# Patient Record
Sex: Female | Born: 1975 | Race: White | Hispanic: No | Marital: Married | Smoking: Never smoker
Health system: Southern US, Community
[De-identification: ages and names within clinical notes are randomized; demographics above are authoritative.]

## PROBLEM LIST (undated history)

## (undated) DIAGNOSIS — Z87442 Personal history of urinary calculi: Secondary | ICD-10-CM

## (undated) HISTORY — PX: OTHER SURGICAL HISTORY: SHX169

## (undated) HISTORY — PX: TUBAL LIGATION: SHX77

## (undated) HISTORY — PX: DILATION AND CURETTAGE OF UTERUS: SHX78

---

## 2014-03-16 ENCOUNTER — Emergency Department: Payer: Self-pay | Admitting: Emergency Medicine

## 2014-03-16 LAB — URINALYSIS, COMPLETE
BILIRUBIN, UR: NEGATIVE
Glucose,UR: NEGATIVE mg/dL (ref 0–75)
Ketone: NEGATIVE
NITRITE: NEGATIVE
Ph: 6 (ref 4.5–8.0)
Protein: 100
RBC,UR: 4 /HPF (ref 0–5)
SPECIFIC GRAVITY: 1.01 (ref 1.003–1.030)

## 2014-03-16 LAB — COMPREHENSIVE METABOLIC PANEL
ALK PHOS: 77 U/L
ANION GAP: 9 (ref 7–16)
Albumin: 3.7 g/dL (ref 3.4–5.0)
BILIRUBIN TOTAL: 0.4 mg/dL (ref 0.2–1.0)
BUN: 6 mg/dL — ABNORMAL LOW (ref 7–18)
CO2: 26 mmol/L (ref 21–32)
CREATININE: 0.89 mg/dL (ref 0.60–1.30)
Calcium, Total: 9.2 mg/dL (ref 8.5–10.1)
Chloride: 101 mmol/L (ref 98–107)
EGFR (Non-African Amer.): >60
Glucose: 97 mg/dL (ref 65–99)
OSMOLALITY: 269 (ref 275–301)
POTASSIUM: 3.4 mmol/L — AB (ref 3.5–5.1)
SGOT(AST): 17 U/L (ref 15–37)
SGPT (ALT): 18 U/L
SODIUM: 136 mmol/L (ref 136–145)
Total Protein: 8 g/dL (ref 6.4–8.2)

## 2014-03-16 LAB — CBC
HCT: 39.4 % (ref 35.0–47.0)
HGB: 12.8 g/dL (ref 12.0–16.0)
MCH: 30.3 pg (ref 26.0–34.0)
MCHC: 32.5 g/dL (ref 32.0–36.0)
MCV: 93 fL (ref 80–100)
PLATELETS: 166 10*3/uL (ref 150–440)
RBC: 4.23 10*6/uL (ref 3.80–5.20)
RDW: 13.4 % (ref 11.5–14.5)
WBC: 11.6 10*3/uL — ABNORMAL HIGH (ref 3.6–11.0)

## 2014-03-16 LAB — TROPONIN I

## 2014-03-18 LAB — URINE CULTURE

## 2014-08-10 ENCOUNTER — Emergency Department: Payer: Self-pay | Admitting: Emergency Medicine

## 2014-08-10 LAB — COMPREHENSIVE METABOLIC PANEL
Albumin: 4.1 g/dL (ref 3.4–5.0)
Alkaline Phosphatase: 55 U/L (ref 46–116)
Anion Gap: 7 (ref 7–16)
BUN: 12 mg/dL (ref 7–18)
Bilirubin,Total: 0.7 mg/dL (ref 0.2–1.0)
CALCIUM: 9.1 mg/dL (ref 8.5–10.1)
CO2: 28 mmol/L (ref 21–32)
Chloride: 103 mmol/L (ref 98–107)
Creatinine: 1.04 mg/dL (ref 0.60–1.30)
EGFR (African American): >60
GLUCOSE: 107 mg/dL — AB (ref 65–99)
Osmolality: 276 (ref 275–301)
POTASSIUM: 3.9 mmol/L (ref 3.5–5.1)
SGOT(AST): 11 U/L — ABNORMAL LOW (ref 15–37)
SGPT (ALT): 11 U/L — ABNORMAL LOW (ref 14–63)
Sodium: 138 mmol/L (ref 136–145)
Total Protein: 7.8 g/dL (ref 6.4–8.2)

## 2014-08-10 LAB — CBC
HCT: 40.7 % (ref 35.0–47.0)
HGB: 13.2 g/dL (ref 12.0–16.0)
MCH: 30.6 pg (ref 26.0–34.0)
MCHC: 32.5 g/dL (ref 32.0–36.0)
MCV: 94 fL (ref 80–100)
Platelet: 183 10*3/uL (ref 150–440)
RBC: 4.32 10*6/uL (ref 3.80–5.20)
RDW: 13.7 % (ref 11.5–14.5)
WBC: 11.5 10*3/uL — ABNORMAL HIGH (ref 3.6–11.0)

## 2014-08-10 LAB — URINALYSIS, COMPLETE
BILIRUBIN, UR: NEGATIVE
Bacteria: NONE SEEN
Blood: NEGATIVE
GLUCOSE, UR: NEGATIVE mg/dL (ref 0–75)
LEUKOCYTE ESTERASE: NEGATIVE
Nitrite: NEGATIVE
PROTEIN: NEGATIVE
Ph: 6 (ref 4.5–8.0)
RBC,UR: 1 /HPF (ref 0–5)
SPECIFIC GRAVITY: 1.017 (ref 1.003–1.030)
Squamous Epithelial: <1
WBC UR: 1 /HPF (ref 0–5)

## 2020-08-05 ENCOUNTER — Other Ambulatory Visit: Payer: Self-pay | Admitting: Family Medicine

## 2020-08-05 DIAGNOSIS — Z1231 Encounter for screening mammogram for malignant neoplasm of breast: Secondary | ICD-10-CM

## 2020-08-08 ENCOUNTER — Other Ambulatory Visit: Payer: Self-pay | Admitting: Family Medicine

## 2020-08-08 DIAGNOSIS — N649 Disorder of breast, unspecified: Secondary | ICD-10-CM

## 2020-08-14 ENCOUNTER — Other Ambulatory Visit: Payer: Self-pay

## 2020-08-14 ENCOUNTER — Ambulatory Visit
Admission: RE | Admit: 2020-08-14 | Discharge: 2020-08-14 | Disposition: A | Discharge status: Home / Self Care | Payer: BLUE CROSS/BLUE SHIELD | Source: Ambulatory Visit | Attending: Family Medicine | Admitting: Family Medicine

## 2020-08-14 DIAGNOSIS — N649 Disorder of breast, unspecified: Secondary | ICD-10-CM

## 2022-08-18 DIAGNOSIS — Z13220 Encounter for screening for lipoid disorders: Secondary | ICD-10-CM | POA: Diagnosis not present

## 2022-08-18 DIAGNOSIS — Z130 Encounter for screening for diseases of the blood and blood-forming organs and certain disorders involving the immune mechanism: Secondary | ICD-10-CM | POA: Diagnosis not present

## 2022-08-18 DIAGNOSIS — Z1331 Encounter for screening for depression: Secondary | ICD-10-CM | POA: Diagnosis not present

## 2022-08-18 DIAGNOSIS — Z131 Encounter for screening for diabetes mellitus: Secondary | ICD-10-CM | POA: Diagnosis not present

## 2022-08-18 DIAGNOSIS — Z1211 Encounter for screening for malignant neoplasm of colon: Secondary | ICD-10-CM | POA: Diagnosis not present

## 2022-08-18 DIAGNOSIS — Z0000 Encounter for general adult medical examination without abnormal findings: Secondary | ICD-10-CM | POA: Diagnosis not present

## 2022-08-18 DIAGNOSIS — Z1231 Encounter for screening mammogram for malignant neoplasm of breast: Secondary | ICD-10-CM | POA: Diagnosis not present

## 2022-08-18 DIAGNOSIS — Z124 Encounter for screening for malignant neoplasm of cervix: Secondary | ICD-10-CM | POA: Diagnosis not present

## 2022-08-18 IMAGING — US US BREAST*L* LIMITED INC AXILLA
1 series · 5 of 5 positions shown · non-contrast
Comparison: None.

CLINICAL DATA: Left 12 o'clock subareolar breast area of palpable
concern felt by the patient and her physician.

EXAM:
DIGITAL DIAGNOSTIC BILATERAL MAMMOGRAM WITH TOMOSYNTHESIS AND CAD;
ULTRASOUND LEFT BREAST LIMITED
TECHNIQUE: Bilateral digital diagnostic mammography and breast tomosynthesis
was performed. The images were evaluated with computer-aided
detection.; Targeted ultrasound examination of the left breast was
performed.

[Series 1: us breast*left* limited inc axilla · 0.06mm/px · 5 of 5 slices shown]
[im 1/5]
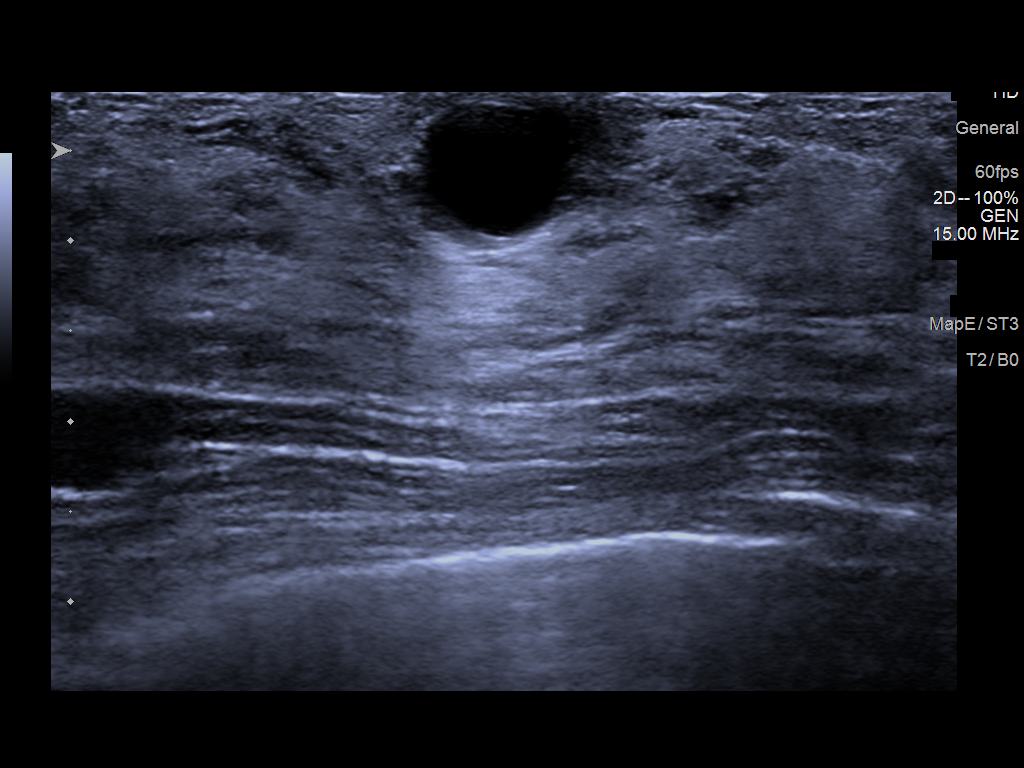
[im 2/5]
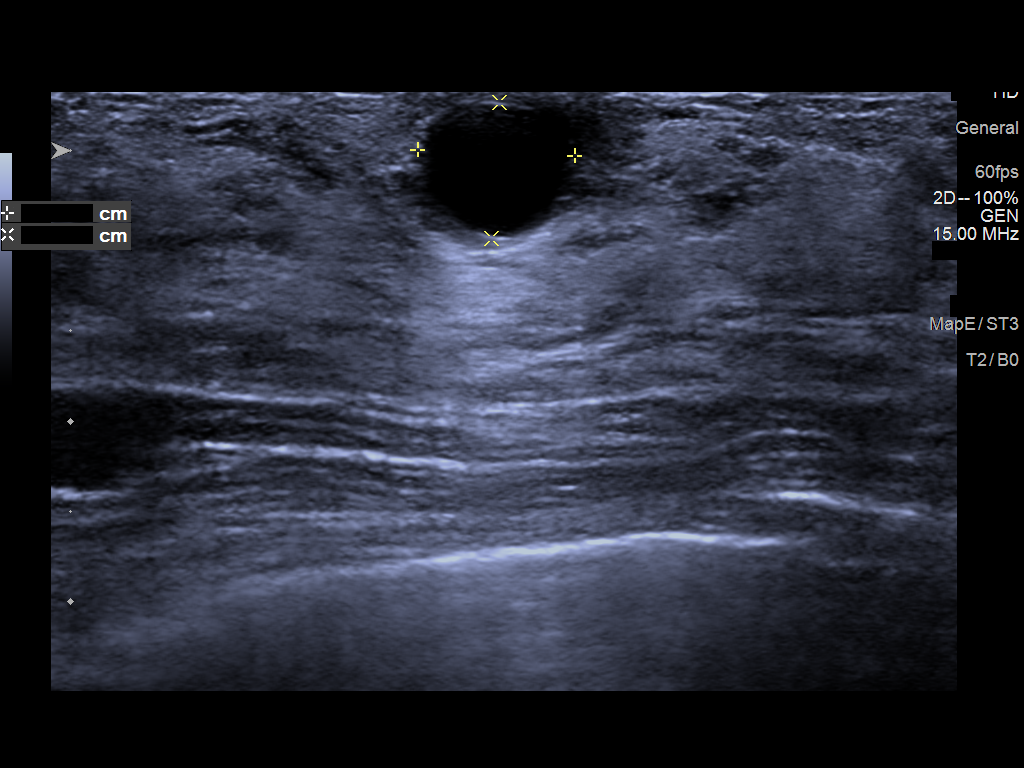
[im 3/5]
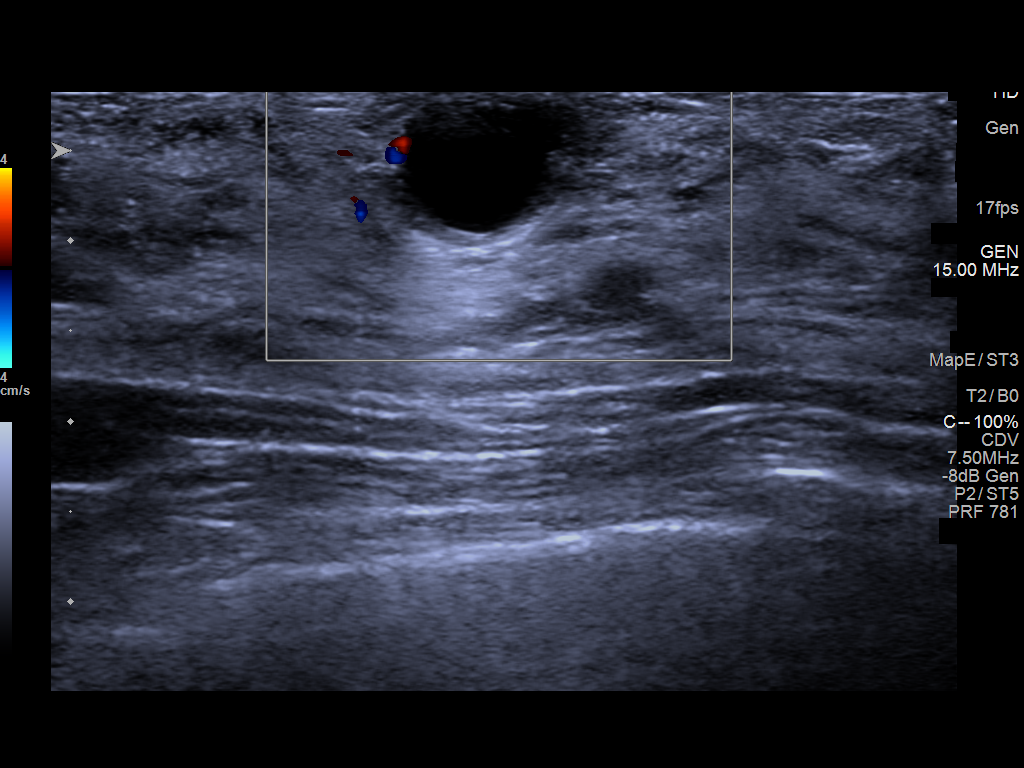
[im 4/5]
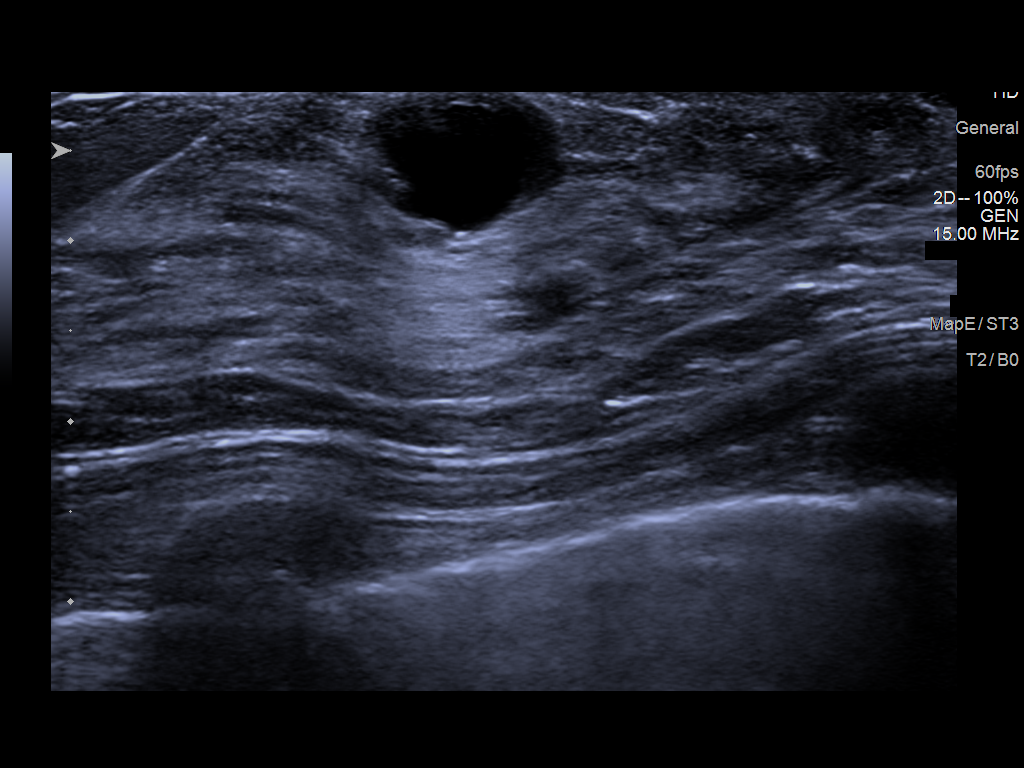
[im 5/5]
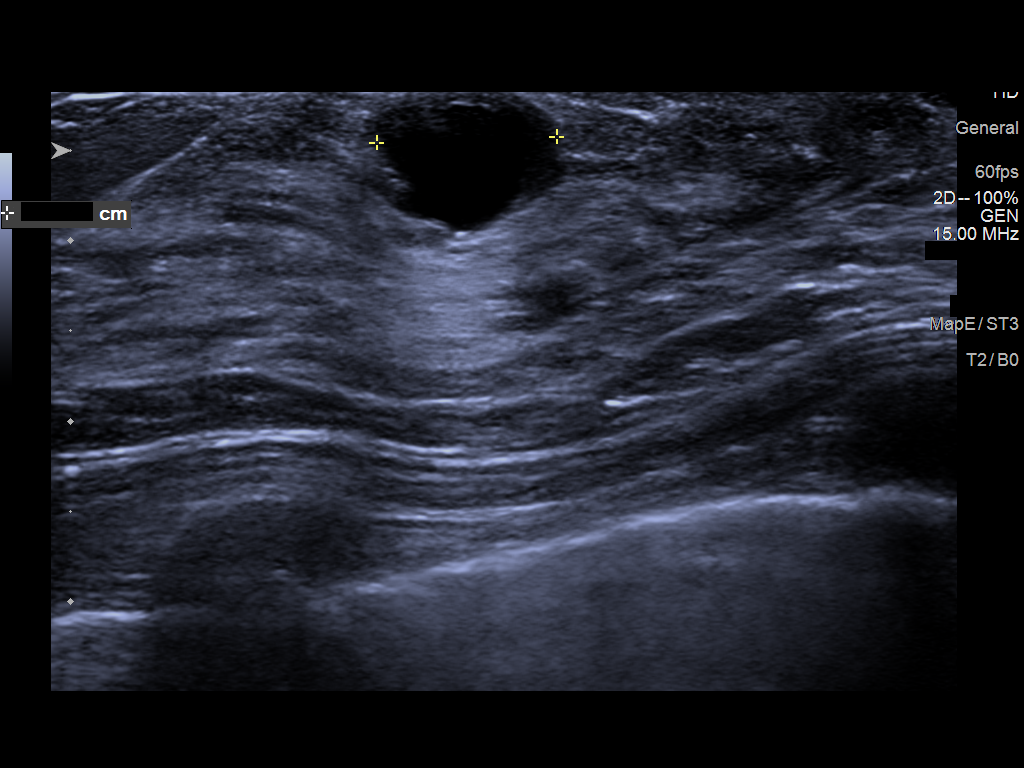

[5 of 5 positions shown; findings below may reference images not displayed]

ACR Breast Density Category d: The breast tissue is extremely dense,
which lowers the sensitivity of mammography.
FINDINGS: Mammographically, there are no suspicious masses, areas of
architectural distortion or microcalcifications in either breast.
The area of palpable concern in the left 12 o'clock breast,
subareolar location, corresponds to isodense to breast parenchyma
circumscribed benign-appearing mass.

On physical exam, freely mobile moderately firm approximately 1 cm
mass is palpated immediately superior to the left nipple.

Targeted left breast ultrasound is performed, showing left breast 12
o'clock, 1 cm from the nipple benign cyst measuring 1.0 x 0.9 x
cm. This finding corresponds to the mammographically seen mass.
IMPRESSION: Left breast 12 o'clock benign cyst corresponds to the area of
palpable concern felt by the patient.

RECOMMENDATION:
Screening mammogram in one year.(Code:JN-G-F15)

I have discussed the findings and recommendations with the patient.
If applicable, a reminder letter will be sent to the patient
regarding the next appointment.

BI-RADS CATEGORY  2: Benign.

## 2022-08-18 IMAGING — MG DIGITAL DIAGNOSTIC BILAT W/ TOMO W/ CAD
6 of 10 series · 6 of 30 positions shown · non-contrast
Comparison: None.

CLINICAL DATA: Left 12 o'clock subareolar breast area of palpable
concern felt by the patient and her physician.

EXAM:
DIGITAL DIAGNOSTIC BILATERAL MAMMOGRAM WITH TOMOSYNTHESIS AND CAD;
ULTRASOUND LEFT BREAST LIMITED
TECHNIQUE: Bilateral digital diagnostic mammography and breast tomosynthesis
was performed. The images were evaluated with computer-aided
detection.; Targeted ultrasound examination of the left breast was
performed.

[L TAN synth-2D]
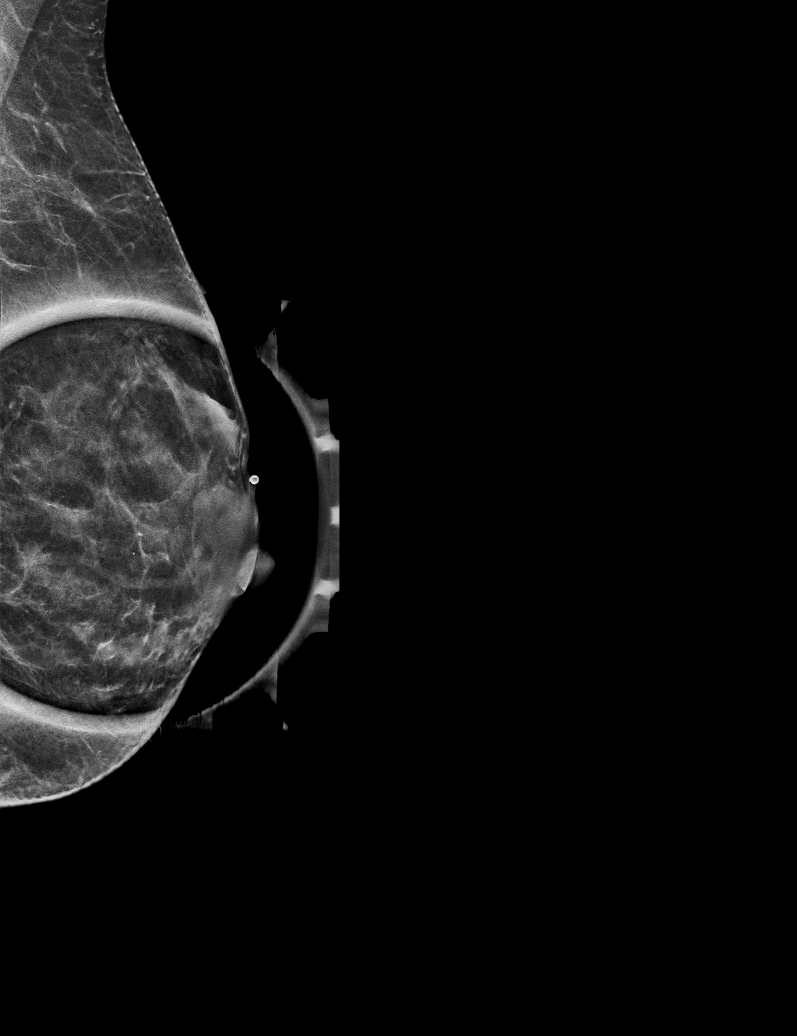

[R MLO synth-2D]
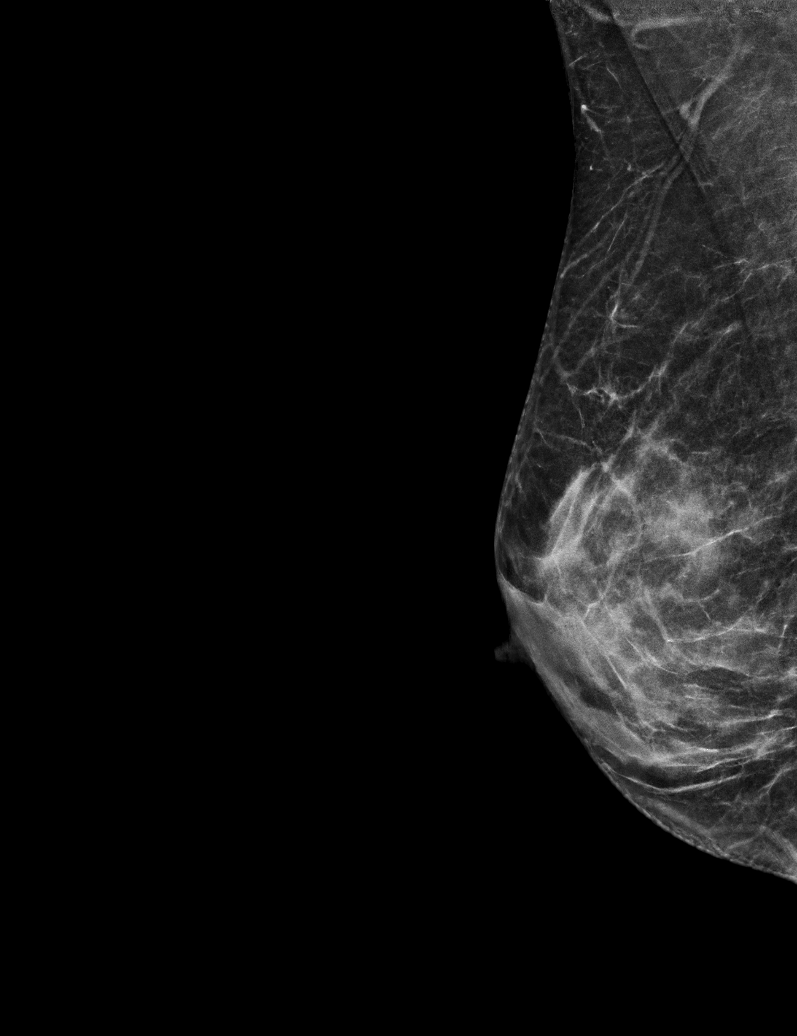

[R CC synth-2D]
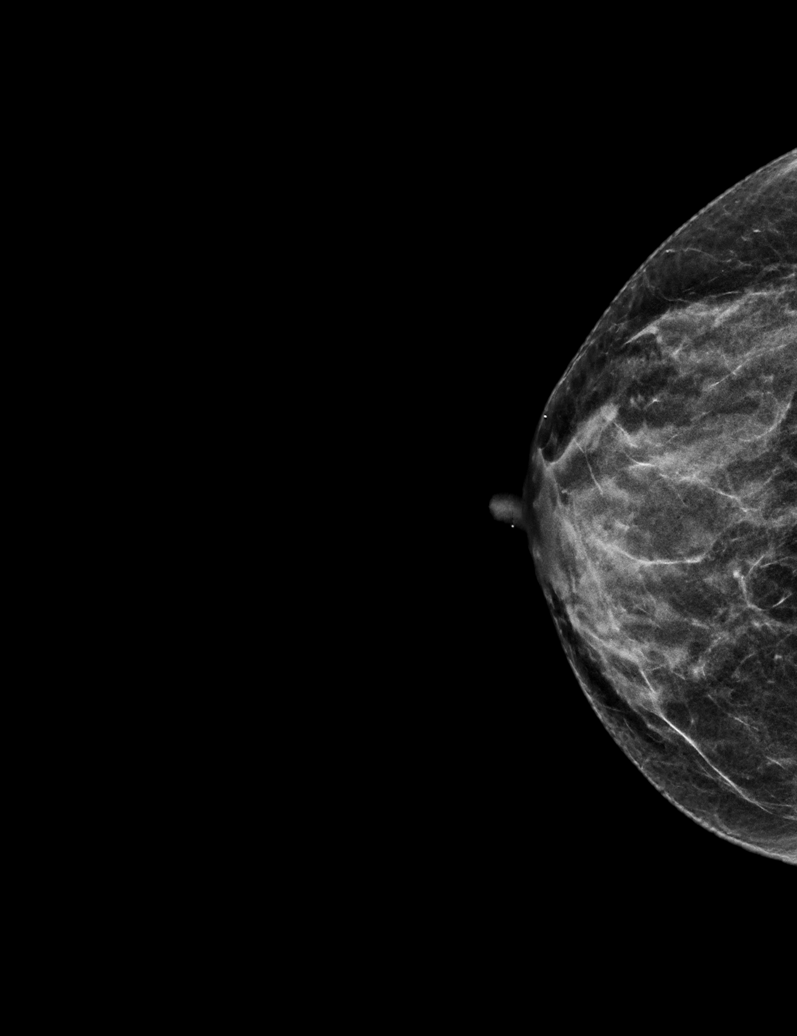

[L MLO synth-2D]
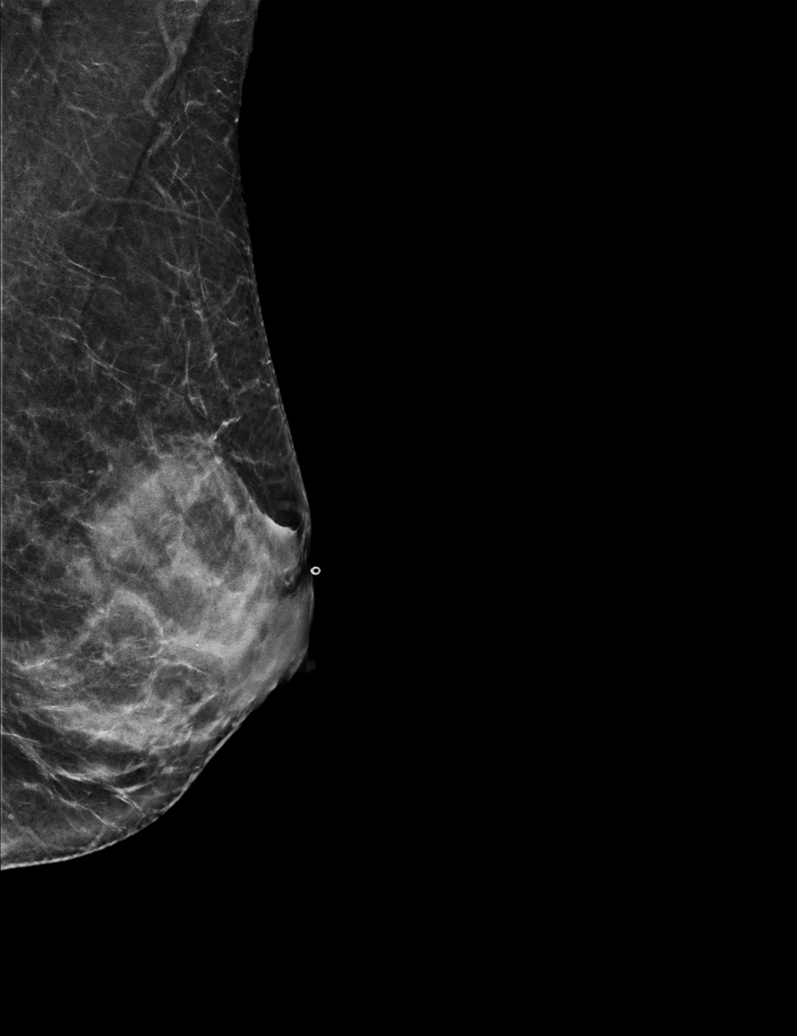

[L CC synth-2D]
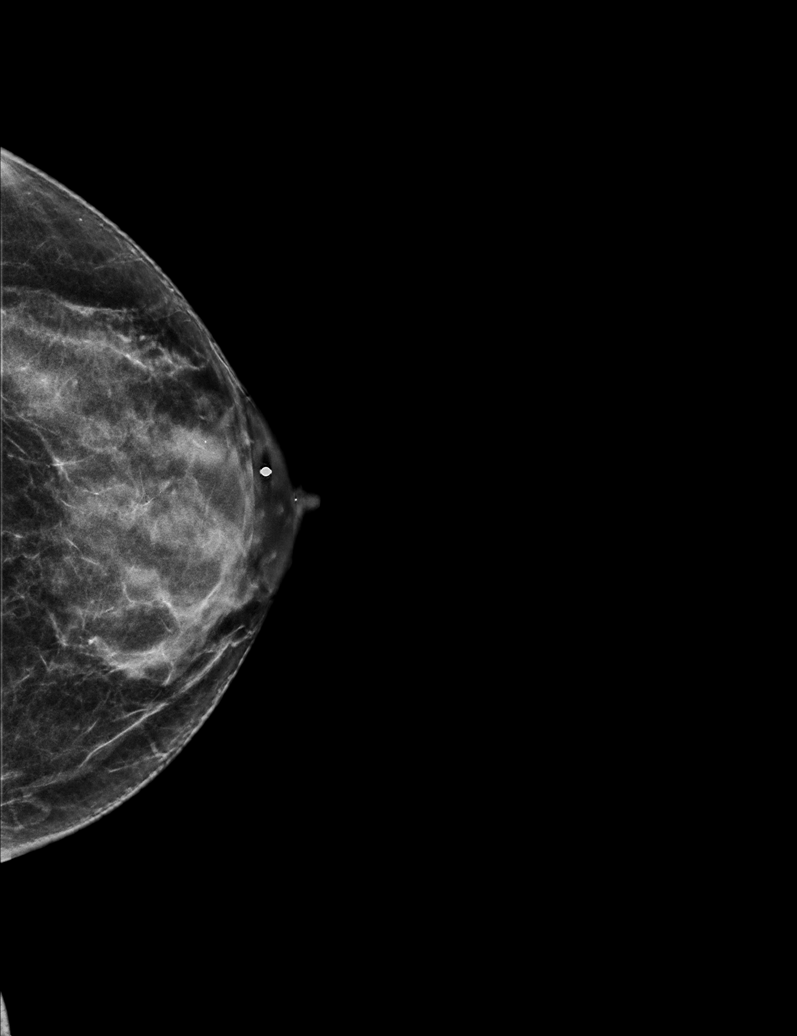

[R MLO tomo · tomo slice 27/53.0]
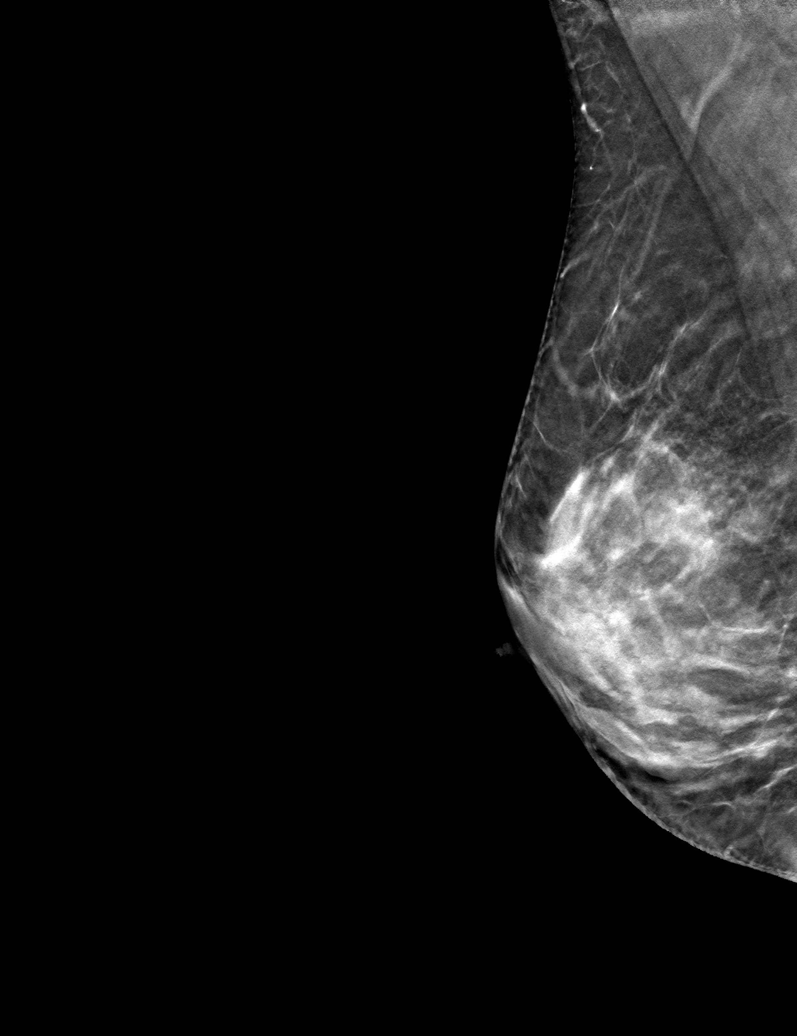

[6 of 30 positions shown; findings below may reference images not displayed]

ACR Breast Density Category d: The breast tissue is extremely dense,
which lowers the sensitivity of mammography.
FINDINGS: Mammographically, there are no suspicious masses, areas of
architectural distortion or microcalcifications in either breast.
The area of palpable concern in the left 12 o'clock breast,
subareolar location, corresponds to isodense to breast parenchyma
circumscribed benign-appearing mass.

On physical exam, freely mobile moderately firm approximately 1 cm
mass is palpated immediately superior to the left nipple.

Targeted left breast ultrasound is performed, showing left breast 12
o'clock, 1 cm from the nipple benign cyst measuring 1.0 x 0.9 x
cm. This finding corresponds to the mammographically seen mass.
IMPRESSION: Left breast 12 o'clock benign cyst corresponds to the area of
palpable concern felt by the patient.

RECOMMENDATION:
Screening mammogram in one year.(Code:JN-G-F15)

I have discussed the findings and recommendations with the patient.
If applicable, a reminder letter will be sent to the patient
regarding the next appointment.

BI-RADS CATEGORY  2: Benign.

## 2022-08-20 ENCOUNTER — Other Ambulatory Visit: Payer: Self-pay | Admitting: Family Medicine

## 2022-08-20 DIAGNOSIS — Z1231 Encounter for screening mammogram for malignant neoplasm of breast: Secondary | ICD-10-CM

## 2022-08-25 DIAGNOSIS — H5203 Hypermetropia, bilateral: Secondary | ICD-10-CM | POA: Diagnosis not present

## 2022-09-01 DIAGNOSIS — Z131 Encounter for screening for diabetes mellitus: Secondary | ICD-10-CM | POA: Diagnosis not present

## 2022-09-01 DIAGNOSIS — Z13220 Encounter for screening for lipoid disorders: Secondary | ICD-10-CM | POA: Diagnosis not present

## 2022-09-01 DIAGNOSIS — Z130 Encounter for screening for diseases of the blood and blood-forming organs and certain disorders involving the immune mechanism: Secondary | ICD-10-CM | POA: Diagnosis not present

## 2022-09-07 ENCOUNTER — Ambulatory Visit
Admission: RE | Admit: 2022-09-07 | Discharge: 2022-09-07 | Disposition: A | Discharge status: Home / Self Care | Payer: 59 | Source: Ambulatory Visit | Attending: Family Medicine | Admitting: Family Medicine

## 2022-09-07 DIAGNOSIS — Z1231 Encounter for screening mammogram for malignant neoplasm of breast: Secondary | ICD-10-CM | POA: Diagnosis not present

## 2023-02-02 ENCOUNTER — Encounter: Payer: Self-pay | Admitting: Gastroenterology

## 2023-02-02 DIAGNOSIS — Z0184 Encounter for antibody response examination: Secondary | ICD-10-CM | POA: Diagnosis not present

## 2023-02-02 DIAGNOSIS — Z111 Encounter for screening for respiratory tuberculosis: Secondary | ICD-10-CM | POA: Diagnosis not present

## 2023-02-02 NOTE — H&P (Signed)
Pre-Procedure H&P   Patient ID: Beverly Maldonado is a 47 y.o. female.  Gastroenterology Provider: Jaynie Collins, DO  Referring Provider: Cyndia Diver, PA PCP: Cyndia Diver, PA-C  Date: 02/03/2023  HPI Ms. Beverly Maldonado is a 47 y.o. female who presents today for Colonoscopy for Colorectal cancer screening .  Initial screening colonoscopy.  No family history of colon cancer or colon polyps.  Daily bowel meant without melena or hematochezia.  Hemoglobin 12 MCV 94 platelets 220,000 creatinine 0.8 A1c 5.5   Past Medical History:  Diagnosis Date   History of kidney stones     Past Surgical History:  Procedure Laterality Date   cystoscopy     DILATION AND CURETTAGE OF UTERUS     TUBAL LIGATION      Family History No h/o GI disease or malignancy  Review of Systems  Constitutional:  Negative for activity change, appetite change, chills, diaphoresis, fatigue, fever and unexpected weight change.  HENT:  Negative for trouble swallowing and voice change.   Respiratory:  Negative for shortness of breath and wheezing.   Cardiovascular:  Negative for chest pain, palpitations and leg swelling.  Gastrointestinal:  Negative for abdominal distention, abdominal pain, anal bleeding, blood in stool, constipation, diarrhea, nausea, rectal pain and vomiting.  Musculoskeletal:  Negative for arthralgias and myalgias.  Skin:  Negative for color change and pallor.  Neurological:  Negative for dizziness, syncope and weakness.  Psychiatric/Behavioral:  Negative for confusion.   All other systems reviewed and are negative.    Medications No current facility-administered medications on file prior to encounter.   Current Outpatient Medications on File Prior to Encounter  Medication Sig Dispense Refill   ascorbic acid (VITAMIN C) 500 MG tablet Take 1,000 mg by mouth daily.     cholecalciferol (VITAMIN D3) 10 MCG/ML LIQD oral liquid Take 400 Units by mouth daily.     ferrous sulfate 325  (65 FE) MG EC tablet Take 325 mg by mouth 3 (three) times daily with meals.     magnesium sulfate (EPSOM SALT) GRAN Take 500 g by mouth as needed for mild constipation.      Pertinent medications related to GI and procedure were reviewed by me with the patient prior to the procedure   Current Facility-Administered Medications:    0.9 %  sodium chloride infusion, , Intravenous, Continuous, Jaynie Collins, DO, Last Rate: 20 mL/hr at 02/03/23 0713, New Bag at 02/03/23 0713  sodium chloride 20 mL/hr at 02/03/23 1610       No Known Allergies Allergies were reviewed by me prior to the procedure  Objective   Body mass index is 26.52 kg/m. Vitals:   02/03/23 0703  BP: 117/66  Pulse: 81  Temp: 97.7 F (36.5 C)  TempSrc: Temporal  SpO2: 100%  Weight: 65.8 kg  Height: 5\' 2"  (1.575 m)     Physical Exam Vitals and nursing note reviewed.  Constitutional:      General: She is not in acute distress.    Appearance: Normal appearance. She is not ill-appearing, toxic-appearing or diaphoretic.  HENT:     Head: Normocephalic and atraumatic.     Nose: Nose normal.     Mouth/Throat:     Mouth: Mucous membranes are moist.     Pharynx: Oropharynx is clear.  Eyes:     General: No scleral icterus.    Extraocular Movements: Extraocular movements intact.  Cardiovascular:     Rate and Rhythm: Normal rate and regular rhythm.  Heart sounds: Normal heart sounds. No murmur heard.    No friction rub. No gallop.  Pulmonary:     Effort: Pulmonary effort is normal. No respiratory distress.     Breath sounds: Normal breath sounds. No wheezing, rhonchi or rales.  Abdominal:     General: Bowel sounds are normal. There is no distension.     Palpations: Abdomen is soft.     Tenderness: There is no abdominal tenderness. There is no guarding or rebound.  Musculoskeletal:     Cervical back: Neck supple.     Right lower leg: No edema.     Left lower leg: No edema.  Skin:    General: Skin is  warm and dry.     Coloration: Skin is not jaundiced or pale.  Neurological:     General: No focal deficit present.     Mental Status: She is alert and oriented to person, place, and time. Mental status is at baseline.  Psychiatric:        Mood and Affect: Mood normal.        Behavior: Behavior normal.        Thought Content: Thought content normal.        Judgment: Judgment normal.      Assessment:  Beverly Maldonado is a 47 y.o. female  who presents today for Colonoscopy for Colorectal cancer screening .  Plan:  Colonoscopy with possible intervention today  Colonoscopy with possible biopsy, control of bleeding, polypectomy, and interventions as necessary has been discussed with the patient/patient representative. Informed consent was obtained from the patient/patient representative after explaining the indication, nature, and risks of the procedure including but not limited to death, bleeding, perforation, missed neoplasm/lesions, cardiorespiratory compromise, and reaction to medications. Opportunity for questions was given and appropriate answers were provided. Patient/patient representative has verbalized understanding is amenable to undergoing the procedure.   Jaynie Collins, DO  [facility]  Portions of the record may have been created with voice recognition software. Occasional wrong-word or 'sound-a-like' substitutions may have occurred due to the inherent limitations of voice recognition software.  Read the chart carefully and recognize, using context, where substitutions may have occurred.

## 2023-02-03 ENCOUNTER — Ambulatory Visit: Payer: 59

## 2023-02-03 ENCOUNTER — Encounter
Admission: RE | Disposition: A | Discharge status: Home / Self Care | Payer: Self-pay | Source: Home / Self Care | Attending: Gastroenterology

## 2023-02-03 ENCOUNTER — Ambulatory Visit
Admission: RE | Admit: 2023-02-03 | Discharge: 2023-02-03 | Disposition: A | Discharge status: Home / Self Care | Payer: 59 | Attending: Gastroenterology | Admitting: Gastroenterology

## 2023-02-03 ENCOUNTER — Encounter: Payer: Self-pay | Admitting: Gastroenterology

## 2023-02-03 DIAGNOSIS — Z87442 Personal history of urinary calculi: Secondary | ICD-10-CM | POA: Insufficient documentation

## 2023-02-03 DIAGNOSIS — Z9889 Other specified postprocedural states: Secondary | ICD-10-CM | POA: Insufficient documentation

## 2023-02-03 DIAGNOSIS — Z1211 Encounter for screening for malignant neoplasm of colon: Secondary | ICD-10-CM | POA: Insufficient documentation

## 2023-02-03 DIAGNOSIS — D120 Benign neoplasm of cecum: Secondary | ICD-10-CM | POA: Diagnosis not present

## 2023-02-03 DIAGNOSIS — Z9851 Tubal ligation status: Secondary | ICD-10-CM | POA: Diagnosis not present

## 2023-02-03 DIAGNOSIS — K635 Polyp of colon: Secondary | ICD-10-CM | POA: Diagnosis not present

## 2023-02-03 HISTORY — PX: POLYPECTOMY: SHX5525

## 2023-02-03 HISTORY — DX: Personal history of urinary calculi: Z87.442

## 2023-02-03 HISTORY — PX: COLONOSCOPY WITH PROPOFOL: SHX5780

## 2023-02-03 LAB — POCT PREGNANCY, URINE: Preg Test, Ur: NEGATIVE

## 2023-02-03 SURGERY — COLONOSCOPY WITH PROPOFOL
Anesthesia: General

## 2023-02-03 MED ORDER — EPHEDRINE SULFATE (PRESSORS) 50 MG/ML IJ SOLN
INTRAMUSCULAR | Status: DC | PRN
  Administered 2023-02-03 (×2): 5 mg via INTRAVENOUS

## 2023-02-03 MED ORDER — PHENYLEPHRINE 80 MCG/ML (10ML) SYRINGE FOR IV PUSH (FOR BLOOD PRESSURE SUPPORT)
PREFILLED_SYRINGE | INTRAVENOUS | Status: DC | PRN
  Administered 2023-02-03: 80 ug via INTRAVENOUS

## 2023-02-03 MED ORDER — STERILE WATER FOR IRRIGATION IR SOLN
Status: DC | PRN
  Administered 2023-02-03: 60 mL

## 2023-02-03 MED ORDER — SODIUM CHLORIDE 0.9 % IV SOLN: INTRAVENOUS | Status: DC

## 2023-02-03 MED ORDER — PROPOFOL 10 MG/ML IV BOLUS
INTRAVENOUS | Status: AC
  Filled 2023-02-03: qty 40

## 2023-02-03 MED ORDER — PHENYLEPHRINE 80 MCG/ML (10ML) SYRINGE FOR IV PUSH (FOR BLOOD PRESSURE SUPPORT)
PREFILLED_SYRINGE | INTRAVENOUS | Status: AC
  Filled 2023-02-03: qty 10

## 2023-02-03 MED ORDER — EPHEDRINE 5 MG/ML INJ
INTRAVENOUS | Status: AC
  Filled 2023-02-03: qty 5

## 2023-02-03 MED ORDER — PROPOFOL 10 MG/ML IV BOLUS
INTRAVENOUS | Status: DC | PRN
Start: 2023-02-03 — End: 2023-02-03
  Administered 2023-02-03: 50 mg via INTRAVENOUS
  Administered 2023-02-03: 20 mg via INTRAVENOUS
  Administered 2023-02-03: 80 mg via INTRAVENOUS

## 2023-02-03 MED ORDER — PROPOFOL 500 MG/50ML IV EMUL
INTRAVENOUS | Status: DC | PRN
  Administered 2023-02-03: 150 ug/kg/min via INTRAVENOUS

## 2023-02-03 NOTE — Anesthesia Postprocedure Evaluation (Signed)
Anesthesia Post Note  Patient: Beverly Maldonado  Procedure(s) Performed: COLONOSCOPY WITH PROPOFOL POLYPECTOMY  Patient location during evaluation: PACU Anesthesia Type: General Level of consciousness: awake and alert Pain management: pain level controlled Vital Signs Assessment: post-procedure vital signs reviewed and stable Respiratory status: spontaneous breathing, nonlabored ventilation, respiratory function stable and patient connected to nasal cannula oxygen Cardiovascular status: blood pressure returned to baseline and stable Postop Assessment: no apparent nausea or vomiting Anesthetic complications: no   No notable events documented.   Last Vitals:  Vitals:   02/03/23 0808 02/03/23 0818  BP:  104/68  Pulse:    Resp: 19   Temp:    SpO2:      Last Pain:  Vitals:   02/03/23 0818  TempSrc:   PainSc: 0-No pain                 Yevette Edwards

## 2023-02-03 NOTE — Anesthesia Preprocedure Evaluation (Signed)
Anesthesia Evaluation  Patient identified by MRN, date of birth, ID band Patient awake    Reviewed: Allergy & Precautions, H&P , NPO status , Patient's Chart, lab work & pertinent test results, reviewed documented beta blocker date and time   Airway Mallampati: II   Neck ROM: full    Dental  (+) Poor Dentition   Pulmonary neg pulmonary ROS   Pulmonary exam normal        Cardiovascular negative cardio ROS Normal cardiovascular exam Rhythm:regular Rate:Normal     Neuro/Psych negative neurological ROS  negative psych ROS   GI/Hepatic negative GI ROS, Neg liver ROS,,,  Endo/Other  negative endocrine ROS    Renal/GU negative Renal ROS  negative genitourinary   Musculoskeletal   Abdominal   Peds  Hematology negative hematology ROS (+)   Anesthesia Other Findings Past Medical History: No date: History of kidney stones Past Surgical History: No date: cystoscopy No date: DILATION AND CURETTAGE OF UTERUS No date: TUBAL LIGATION BMI    Body Mass Index: 26.52 kg/m     Reproductive/Obstetrics negative OB ROS                             Anesthesia Physical Anesthesia Plan  ASA: 2  Anesthesia Plan: General   Post-op Pain Management:    Induction:   PONV Risk Score and Plan:   Airway Management Planned:   Additional Equipment:   Intra-op Plan:   Post-operative Plan:   Informed Consent: I have reviewed the patients History and Physical, chart, labs and discussed the procedure including the risks, benefits and alternatives for the proposed anesthesia with the patient or authorized representative who has indicated his/her understanding and acceptance.     Dental Advisory Given  Plan Discussed with: CRNA  Anesthesia Plan Comments:        Anesthesia Quick Evaluation

## 2023-02-03 NOTE — Op Note (Signed)
[facility] Gastroenterology Patient Name: Beverly Maldonado Procedure Date: 02/03/2023 7:03 AM MRN: [id number] Account #: [id number] Date of Birth: 06-May-1976 Admit Type: Outpatient Age: 47 Room: [facility] ENDO ROOM 1 Gender: Female Note Status: Finalized Instrument Name: Colonoscope [id number] Procedure:             Colonoscopy Indications:           Screening for colorectal malignant neoplasm Providers:             Trenda Moots, DO Referring MD:          Jaynie Collins DO, DO (Referring MD) Medicines:             Monitored Anesthesia Care Complications:         No immediate complications. Estimated blood loss:                         Minimal. Procedure:             Pre-Anesthesia Assessment:                        - Prior to the procedure, a History and Physical was                         performed, and patient medications and allergies were                         reviewed. The patient is competent. The risks and                         benefits of the procedure and the sedation options and                         risks were discussed with the patient. All questions                         were answered and informed consent was obtained.                         Patient identification and proposed procedure were                         verified by the physician, the nurse, the anesthetist                         and the technician in the endoscopy suite. Mental                         Status Examination: alert and oriented. Airway                         Examination: normal oropharyngeal airway and neck                         mobility. Respiratory Examination: clear to                         auscultation. CV Examination: RRR, no murmurs, no S3  or S4. Prophylactic Antibiotics: The patient does not                         require prophylactic antibiotics. Prior                         Anticoagulants: The patient has taken no  anticoagulant                         or antiplatelet agents. ASA Grade Assessment: II - A                         patient with mild systemic disease. After reviewing                         the risks and benefits, the patient was deemed in                         satisfactory condition to undergo the procedure. The                         anesthesia plan was to use monitored anesthesia care                         (MAC). Immediately prior to administration of                         medications, the patient was re-assessed for adequacy                         to receive sedatives. The heart rate, respiratory                         rate, oxygen saturations, blood pressure, adequacy of                         pulmonary ventilation, and response to care were                         monitored throughout the procedure. The physical                         status of the patient was re-assessed after the                         procedure.                        After obtaining informed consent, the colonoscope was                         passed under direct vision. Throughout the procedure,                         the patient's blood pressure, pulse, and oxygen                         saturations were monitored continuously. The  Colonoscope was introduced through the anus and                         advanced to the the terminal ileum, with                         identification of the appendiceal orifice and IC                         valve. The colonoscopy was performed without                         difficulty. The patient tolerated the procedure well.                         The quality of the bowel preparation was evaluated                         using the BBPS [facility] Bowel Preparation Scale) with                         scores of: Right Colon = 3, Transverse Colon = 3 and                         Left Colon = 3 (entire mucosa seen well with no                          residual staining, small fragments of stool or opaque                         liquid). The total BBPS score equals 9. The terminal                         ileum, ileocecal valve, appendiceal orifice, and                         rectum were photographed. Findings:      The perianal and digital rectal examinations were normal. Pertinent       negatives include normal sphincter tone.      The terminal ileum appeared normal. Estimated blood loss: none.      A 1 mm polyp was found in the cecum. The polyp was sessile. The polyp       was removed with a jumbo cold forceps. Resection and retrieval were       complete. Estimated blood loss was minimal.      Retroflexion in the right colon was performed.      The exam was otherwise without abnormality on direct and retroflexion       views. Impression:            - The examined portion of the ileum was normal.                        - One 1 mm polyp in the cecum, removed with a jumbo                         cold forceps. Resected and retrieved.                        -  The examination was otherwise normal on direct and                         retroflexion views. Recommendation:        - Patient has a contact number available for                         emergencies. The signs and symptoms of potential                         delayed complications were discussed with the patient.                         Return to normal activities tomorrow. Written                         discharge instructions were provided to the patient.                        - Discharge patient to home.                        - Resume previous diet.                        - Continue present medications.                        - Await pathology results.                        - Repeat colonoscopy for surveillance based on                         pathology results.                        - Return to referring physician as previously                         scheduled.                         - The findings and recommendations were discussed with                         the patient. Procedure Code(s):     --- Professional ---                        [phone], Colonoscopy, flexible; with biopsy, single or                         multiple Diagnosis Code(s):     --- Professional ---                        Z12.11, Encounter for screening for malignant neoplasm                         of colon  D12.0, Benign neoplasm of cecum CPT copyright 2022 [employer]. All rights reserved. The codes documented in this report are preliminary and upon coder review may  be revised to meet current compliance requirements. Attending Participation:      I personally performed the entire procedure. Elfredia Nevins, DO Jaynie Collins DO, DO 02/03/2023 8:00:09 AM This report has been signed electronically. Number of Addenda: 0 Note Initiated On: 02/03/2023 7:03 AM Scope Withdrawal Time: 0 hours 11 minutes 47 seconds  Total Procedure Duration: 0 hours 18 minutes 56 seconds  Estimated Blood Loss:  Estimated blood loss was minimal.      [facility]

## 2023-02-03 NOTE — Transfer of Care (Signed)
Immediate Anesthesia Transfer of Care Note  Patient: Beverly Maldonado  Procedure(s) Performed: COLONOSCOPY WITH PROPOFOL POLYPECTOMY  Patient Location: PACU  Anesthesia Type:MAC  Level of Consciousness: awake  Airway & Oxygen Therapy: Patient Spontanous Breathing  Post-op Assessment: Report given to RN and Post -op Vital signs reviewed and stable  Post vital signs: Reviewed and stable  Last Vitals:  Vitals Value Taken Time  BP 93/51 02/03/23 0758  Temp 36.3 C 02/03/23 0758  Pulse 90 02/03/23 0758  Resp 21 02/03/23 0758  SpO2 100 % 02/03/23 0758  Vitals shown include unfiled device data.  Last Pain:  Vitals:   02/03/23 0758  TempSrc: Temporal  PainSc: Asleep         Complications: No notable events documented.

## 2023-02-03 NOTE — Interval H&P Note (Signed)
History and Physical Interval Note: Preprocedure H&P from 02/03/23  was reviewed and there was no interval change after seeing and examining the patient.  Written consent was obtained from the patient after discussion of risks, benefits, and alternatives. Patient has consented to proceed with Colonoscopy with possible intervention   02/03/2023 7:20 AM  Beverly Maldonado  has presented today for surgery, with the diagnosis of V76.51 (ICD-9-CM) - Z12.11 (ICD-10-CM) - Colon cancer screening.  The various methods of treatment have been discussed with the patient and family. After consideration of risks, benefits and other options for treatment, the patient has consented to  Procedure(s): COLONOSCOPY WITH PROPOFOL (N/A) as a surgical intervention.  The patient's history has been reviewed, patient examined, no change in status, stable for surgery.  I have reviewed the patient's chart and labs.  Questions were answered to the patient's satisfaction.     Jaynie Collins

## 2023-02-04 ENCOUNTER — Encounter: Payer: Self-pay | Admitting: Gastroenterology

## 2023-03-09 DIAGNOSIS — N390 Urinary tract infection, site not specified: Secondary | ICD-10-CM | POA: Diagnosis not present

## 2023-03-09 DIAGNOSIS — R300 Dysuria: Secondary | ICD-10-CM | POA: Diagnosis not present

## 2023-09-27 DIAGNOSIS — H5203 Hypermetropia, bilateral: Secondary | ICD-10-CM | POA: Diagnosis not present

## 2023-10-12 DIAGNOSIS — Z131 Encounter for screening for diabetes mellitus: Secondary | ICD-10-CM | POA: Diagnosis not present

## 2023-10-12 DIAGNOSIS — Z13220 Encounter for screening for lipoid disorders: Secondary | ICD-10-CM | POA: Diagnosis not present

## 2023-10-12 DIAGNOSIS — E663 Overweight: Secondary | ICD-10-CM | POA: Diagnosis not present

## 2023-10-12 DIAGNOSIS — Z1329 Encounter for screening for other suspected endocrine disorder: Secondary | ICD-10-CM | POA: Diagnosis not present

## 2023-10-12 DIAGNOSIS — F5104 Psychophysiologic insomnia: Secondary | ICD-10-CM | POA: Diagnosis not present

## 2023-10-12 DIAGNOSIS — Z0000 Encounter for general adult medical examination without abnormal findings: Secondary | ICD-10-CM | POA: Diagnosis not present

## 2023-12-09 ENCOUNTER — Emergency Department

## 2023-12-09 ENCOUNTER — Emergency Department
Admission: EM | Admit: 2023-12-09 | Discharge: 2023-12-09 | Disposition: A | Discharge status: Home / Self Care | Attending: Emergency Medicine | Admitting: Emergency Medicine

## 2023-12-09 ENCOUNTER — Other Ambulatory Visit: Payer: Self-pay

## 2023-12-09 DIAGNOSIS — N939 Abnormal uterine and vaginal bleeding, unspecified: Secondary | ICD-10-CM | POA: Diagnosis not present

## 2023-12-09 DIAGNOSIS — R102 Pelvic and perineal pain: Secondary | ICD-10-CM | POA: Diagnosis not present

## 2023-12-09 DIAGNOSIS — N83291 Other ovarian cyst, right side: Secondary | ICD-10-CM | POA: Diagnosis not present

## 2023-12-09 DIAGNOSIS — N83292 Other ovarian cyst, left side: Secondary | ICD-10-CM | POA: Diagnosis not present

## 2023-12-09 LAB — BASIC METABOLIC PANEL WITH GFR
Anion gap: 7 (ref 5–15)
BUN: 17 mg/dL (ref 6–20)
CO2: 28 mmol/L (ref 22–32)
Calcium: 9.8 mg/dL (ref 8.9–10.3)
Chloride: 105 mmol/L (ref 98–111)
Creatinine, Ser: 0.95 mg/dL (ref 0.44–1.00)
GFR, Estimated: >60 mL/min (ref >60)
Glucose, Bld: 103 mg/dL — ABNORMAL HIGH (ref 70–99)
Potassium: 4.3 mmol/L (ref 3.5–5.1)
Sodium: 140 mmol/L (ref 135–145)

## 2023-12-09 LAB — CBC
HCT: 37.5 % (ref 36.0–46.0)
Hemoglobin: 12.2 g/dL (ref 12.0–15.0)
MCH: 31.8 pg (ref 26.0–34.0)
MCHC: 32.5 g/dL (ref 30.0–36.0)
MCV: 97.7 fL (ref 80.0–100.0)
Platelets: 252 10*3/uL (ref 150–400)
RBC: 3.84 MIL/uL — ABNORMAL LOW (ref 3.87–5.11)
RDW: 13.2 % (ref 11.5–15.5)
WBC: 7.7 10*3/uL (ref 4.0–10.5)
nRBC: 0 % (ref 0.0–0.2)

## 2023-12-09 LAB — POC URINE PREG, ED: Preg Test, Ur: NEGATIVE

## 2023-12-09 LAB — URINALYSIS, ROUTINE W REFLEX MICROSCOPIC
Bilirubin Urine: NEGATIVE
Glucose, UA: NEGATIVE mg/dL
Hgb urine dipstick: NEGATIVE
Ketones, ur: NEGATIVE mg/dL
Leukocytes,Ua: NEGATIVE
Nitrite: NEGATIVE
Protein, ur: NEGATIVE mg/dL
Specific Gravity, Urine: 1.012 (ref 1.005–1.030)
pH: 7 (ref 5.0–8.0)

## 2023-12-09 LAB — TYPE AND SCREEN
ABO/RH(D): O NEG
Antibody Screen: NEGATIVE

## 2023-12-09 MED ORDER — MEDROXYPROGESTERONE ACETATE 10 MG PO TABS: 10.0000 mg | ORAL_TABLET | Freq: Every day | ORAL | 0 refills | Status: DC

## 2023-12-09 NOTE — ED Provider Notes (Signed)
  Physical Exam  BP 109/73 (BP Location: Left Arm)   Pulse 76   Temp 98.4 F (36.9 C) (Oral)   Resp 16   Ht 5\' 1"  (1.549 m)   Wt 66.7 kg   LMP 12/06/2023   SpO2 100%   BMI 27.78 kg/m   Physical Exam  Procedures  Procedures  ED Course / MDM    Medical Decision Making Amount and/or Complexity of Data Reviewed Labs: ordered. Radiology: ordered.  Risk Prescription drug management.   Assuming care of patient from Monda Angry, PA-C See original H&P along with physical exam.  In short patient had heavy vaginal bleeding.  We are awaiting ultrasound results for discharge.  Ultrasound of the pelvis, independently reviewed interpreted by me as being negative for any acute abnormality.  No fibroids.  I did explain this finding to the patient.  I offered her Provera  for the heavy menstrual bleeding.  Sent this to the pharmacy for her.  She is to follow-up with her GYN doctor.  Patient is in agreement treatment plan.  Strict instructions to return if she is worsening.  Discharged stable condition.       Beverly Figures, PA-C 12/09/23 2211    Beverly Conn, MD 12/10/23 2106

## 2023-12-09 NOTE — ED Notes (Signed)
 Patient taken to ultrasound.

## 2023-12-09 NOTE — ED Provider Notes (Signed)
 [facility] Provider Note    Event Date/Time   First MD Initiated Contact with Patient 12/09/23 1607     (approximate)   History   Vaginal Bleeding   HPI  Beverly Maldonado is a 48 y.o. female with PMH of kidney stones and tubal ligation presenting for evaluation of heavy vaginal bleeding.  Patient reports she is on day 3 of her period and it usually slows down at this point.  She states she has been through 5 super tampons today and is also soaking through an ultra maxi pad.  She is also having some lower pelvic pain and back pain.  Has had occasional dizziness with standing.  Pain is well-controlled with Tylenol.      Physical Exam   Triage Vital Signs: ED Triage Vitals  Encounter Vitals Group     BP 12/09/23 1600 (!) 137/94     Systolic BP Percentile --      Diastolic BP Percentile --      Pulse Rate 12/09/23 1600 66     Resp 12/09/23 1600 20     Temp 12/09/23 1602 98.4 F (36.9 C)     Temp Source 12/09/23 1600 Oral     SpO2 12/09/23 1600 98 %     Weight 12/09/23 1600 147 lb (66.7 kg)     Height 12/09/23 1600 5\' 1"  (1.549 m)     Head Circumference --      Peak Flow --      Pain Score 12/09/23 1600 2     Pain Loc --      Pain Education --      Exclude from Growth Chart --     Most recent vital signs: Vitals:   12/09/23 1600 12/09/23 1602  BP: (!) 137/94   Pulse: 66   Resp: 20   Temp:  98.4 F (36.9 C)  SpO2: 98%    General: Awake, no distress.  CV:  Good peripheral perfusion.  Resp:  Normal effort.  Abd:  No distention.  Other:     ED Results / Procedures / Treatments   Labs (all labs ordered are listed, but only abnormal results are displayed) Labs Reviewed  CBC - Abnormal; Notable for the following components:      Result Value   RBC 3.84 (*)    All other components within normal limits  BASIC METABOLIC PANEL WITH GFR - Abnormal; Notable for the following components:   Glucose, Bld 103 (*)    All other components within  normal limits  URINALYSIS, ROUTINE W REFLEX MICROSCOPIC - Abnormal; Notable for the following components:   Color, Urine YELLOW (*)    APPearance CLOUDY (*)    All other components within normal limits  POC URINE PREG, ED  TYPE AND SCREEN     RADIOLOGY  Pelvic ultrasound obtained and pending.   PROCEDURES:  Critical Care performed: No  Procedures   MEDICATIONS ORDERED IN ED: Medications - No data to display   IMPRESSION / MDM / ASSESSMENT AND PLAN / ED COURSE  I reviewed the triage vital signs and the nursing notes.                             48 year old female presents for evaluation of heavy vaginal bleeding.  Diastolic blood pressure slightly elevated otherwise vital signs are stable.  Patient NAD on exam.  Differential diagnosis includes, but is not limited to, menstrual bleeding, uterine  fibroids, polyps, malignancy, anemia.  Patient's presentation is most consistent with acute complicated illness / injury requiring diagnostic workup.  CBC shows normal H/H. BMP unremarkable. UA unremarkable. Pregnancy test is negative.  Will obtain pelvic ultrasound to look for fibroids and polyps. If normal, patient would be stable for outpatient management.  Care of the patient will be passed off to the oncoming provider who will determine the final diagnosis and plan.      FINAL CLINICAL IMPRESSION(S) / ED DIAGNOSES   Final diagnoses:  Vaginal bleeding  Pelvic pain     Rx / DC Orders   ED Discharge Orders     None        Note:  This document was prepared using Dragon voice recognition software and may include unintentional dictation errors.   Phyliss Breen, PA-C 12/09/23 1840    Charleen Conn, MD 12/09/23 1910

## 2023-12-09 NOTE — ED Triage Notes (Signed)
 Pt to Ed via POV from work. Pt reports is on day 3 of her period. Pt reports saturated 4 tampons overnight. Pt in on 5th tampon today and soaked through ultra maxi pad. No blood thinner. Pt reports lower back pain. Pt reports some dizziness with standing. Last tylenol at 2pm around 1000mg 

## 2023-12-12 ENCOUNTER — Emergency Department
Admission: EM | Admit: 2023-12-12 | Discharge: 2023-12-12 | Disposition: A | Discharge status: Home / Self Care | Attending: Emergency Medicine | Admitting: Emergency Medicine

## 2023-12-12 ENCOUNTER — Emergency Department

## 2023-12-12 ENCOUNTER — Encounter: Payer: Self-pay | Admitting: Emergency Medicine

## 2023-12-12 ENCOUNTER — Other Ambulatory Visit: Payer: Self-pay

## 2023-12-12 DIAGNOSIS — N200 Calculus of kidney: Secondary | ICD-10-CM

## 2023-12-12 DIAGNOSIS — R109 Unspecified abdominal pain: Secondary | ICD-10-CM | POA: Diagnosis not present

## 2023-12-12 DIAGNOSIS — R1084 Generalized abdominal pain: Secondary | ICD-10-CM | POA: Diagnosis not present

## 2023-12-12 DIAGNOSIS — K760 Fatty (change of) liver, not elsewhere classified: Secondary | ICD-10-CM | POA: Diagnosis not present

## 2023-12-12 DIAGNOSIS — K429 Umbilical hernia without obstruction or gangrene: Secondary | ICD-10-CM | POA: Diagnosis not present

## 2023-12-12 LAB — HEPATIC FUNCTION PANEL
ALT: 13 U/L (ref 0–44)
AST: 20 U/L (ref 15–41)
Albumin: 4 g/dL (ref 3.5–5.0)
Alkaline Phosphatase: 48 U/L (ref 38–126)
Bilirubin, Direct: <0.1 mg/dL (ref 0.0–0.2)
Total Bilirubin: 0.5 mg/dL (ref 0.0–1.2)
Total Protein: 6.9 g/dL (ref 6.5–8.1)

## 2023-12-12 LAB — BASIC METABOLIC PANEL WITH GFR
Anion gap: 8 (ref 5–15)
BUN: 13 mg/dL (ref 6–20)
CO2: 25 mmol/L (ref 22–32)
Calcium: 9.3 mg/dL (ref 8.9–10.3)
Chloride: 106 mmol/L (ref 98–111)
Creatinine, Ser: 1 mg/dL (ref 0.44–1.00)
GFR, Estimated: >60 mL/min (ref >60)
Glucose, Bld: 119 mg/dL — ABNORMAL HIGH (ref 70–99)
Potassium: 3.9 mmol/L (ref 3.5–5.1)
Sodium: 139 mmol/L (ref 135–145)

## 2023-12-12 LAB — CBC
HCT: 37.7 % (ref 36.0–46.0)
Hemoglobin: 12.4 g/dL (ref 12.0–15.0)
MCH: 32 pg (ref 26.0–34.0)
MCHC: 32.9 g/dL (ref 30.0–36.0)
MCV: 97.2 fL (ref 80.0–100.0)
Platelets: 267 10*3/uL (ref 150–400)
RBC: 3.88 MIL/uL (ref 3.87–5.11)
RDW: 13.3 % (ref 11.5–15.5)
WBC: 8 10*3/uL (ref 4.0–10.5)
nRBC: 0 % (ref 0.0–0.2)

## 2023-12-12 LAB — URINALYSIS, ROUTINE W REFLEX MICROSCOPIC
Bacteria, UA: NONE SEEN
Bilirubin Urine: NEGATIVE
Glucose, UA: NEGATIVE mg/dL
Ketones, ur: NEGATIVE mg/dL
Nitrite: NEGATIVE
Protein, ur: 100 mg/dL — AB
RBC / HPF: >50 RBC/hpf (ref 0–5)
Specific Gravity, Urine: 1.017 (ref 1.005–1.030)
pH: 6 (ref 5.0–8.0)

## 2023-12-12 LAB — HCG, QUANTITATIVE, PREGNANCY: hCG, Beta Chain, Quant, S: <1 m[IU]/mL (ref <5)

## 2023-12-12 LAB — LIPASE, BLOOD: Lipase: 36 U/L (ref 11–51)

## 2023-12-12 MED ORDER — ONDANSETRON HCL 4 MG/2ML IJ SOLN
4.0000 mg | Freq: Once | INTRAMUSCULAR | Status: AC
  Administered 2023-12-12: 4 mg via INTRAVENOUS
  Filled 2023-12-12: qty 2

## 2023-12-12 MED ORDER — SODIUM CHLORIDE 0.9 % IV BOLUS (SEPSIS)
1000.0000 mL | Freq: Once | INTRAVENOUS | Status: AC
  Administered 2023-12-12: 1000 mL via INTRAVENOUS

## 2023-12-12 MED ORDER — HYDROMORPHONE HCL 1 MG/ML IJ SOLN
1.0000 mg | Freq: Once | INTRAMUSCULAR | Status: AC
  Administered 2023-12-12: 1 mg via INTRAVENOUS
  Filled 2023-12-12: qty 1

## 2023-12-12 MED ORDER — KETOROLAC TROMETHAMINE 30 MG/ML IJ SOLN
30.0000 mg | Freq: Once | INTRAMUSCULAR | Status: AC
  Administered 2023-12-12: 30 mg via INTRAVENOUS
  Filled 2023-12-12: qty 1

## 2023-12-12 MED ORDER — IBUPROFEN 200 MG PO TABS: 600.0000 mg | ORAL_TABLET | Freq: Three times a day (TID) | ORAL | 2 refills | Status: AC | PRN

## 2023-12-12 MED ORDER — ONDANSETRON 4 MG PO TBDP: 4.0000 mg | ORAL_TABLET | Freq: Three times a day (TID) | ORAL | 0 refills | Status: AC | PRN

## 2023-12-12 NOTE — ED Notes (Signed)
 Pt transported to CT at this time.

## 2023-12-12 NOTE — Discharge Instructions (Addendum)
 Your evaluation in the emergency department was notable for kidney stones, but fortunately we saw none of these stones lower down in your urinary tract.  It is possible he may have recently passed a kidney stone.  As discussed, we have added a urine culture, and should result in 1-2 days--you can follow these results online.  Also placed a referral for you to follow-up with a urologist.  You can use Motrin as needed for any ongoing discomfort, and I also prescribed you a nausea medication to use as needed.  Please also follow-up with your primary care provider, and return to the emergency department with any new or worsening symptoms.

## 2023-12-12 NOTE — ED Notes (Signed)
 Pt is CAOx4, breathing normally, and normal in color. PT is lying in bed and appears to be comfortable at this time. Pt reports relief form pain meds given. Pt denies any needs.

## 2023-12-12 NOTE — ED Provider Notes (Signed)
  Physical Exam  BP 135/82 (BP Location: Right Arm)   Pulse 86   Temp 98.4 F (36.9 C) (Oral)   Resp 18   Ht 5\' 1"  (1.549 m)   Wt 67 kg   LMP 12/06/2023   SpO2 98%   BMI 27.91 kg/m   Physical Exam  Procedures  Procedures  ED Course / MDM   Clinical Course as of 12/12/23 0906  Mon Dec 12, 2023  0721 Signout from Dr. Author Board: [id number] ER RN p/w flank pain UA w/ hematuria, some wbcs but no bacteria, nitrite negative, do not suspect underlying UTI. No leukocytosis, fever.  CTAP pending.  Sx controlled w/ tx.   TO DO: - f/u CTAP [MM]  0806 Hcg neg [MM]  0835 CTAP: IMPRESSION: 1. There is a 9 x 10 mm nonobstructing calculus in the right kidney lower pole calyx. There are additional 2 other smaller 1-2 mm nonobstructing calculi in the right kidney and 2, 1-2 mm nonobstructing calculi in the left kidney. No ureterolithiasis or obstructive uropathy on either side. 2. Multiple other nonacute observations, as described above.   [MM]  [phone] Patient reevaluated, vital signs stable, well-appearing and pain has resolved.  Discussed CT findings.  Notes pain resolved immediately after urinating here that this is also around the time she got pain medications--consider possibility of recently passed urolithiasis.  Does not have any significant urinary symptoms, noted only 1 episode of urgency.  No fever, leukocytosis.  No ongoing flank pain.  Urinalysis somewhat equivocal, not clearly UTI--Will defer antibiotics at this time and add urine culture.  Is not followed by urologist here, will place referral for evaluation and management of recurrent urolithiasis and current nephrolithiasis.  No indication for Flomax at this time though will Rx Motrin as needed and Zofran as needed.  Also plan for PMD follow-up.  ED return precautions in place.  Patient agrees with plan. [MM]    Clinical Course User Index [MM] Collis Deaner, MD   Medical Decision Making Amount and/or Complexity of Data Reviewed Labs:  ordered. Radiology: ordered.  Risk Prescription drug management.          Collis Deaner, MD 12/12/23 [phone]

## 2023-12-12 NOTE — ED Provider Notes (Signed)
 [facility] Provider Note    Event Date/Time   First MD Initiated Contact with Patient 12/12/23 [phone]     (approximate)   History   Flank Pain (Left)   HPI  Beverly Maldonado is a 48 y.o. female with history of Niesen's, tubal ligation who presents to the emergency department with flank and diffuse abdominal pain.  Has had nausea without vomiting.  Symptoms came on suddenly and feels similar to previous kidney stones.  No dysuria or hematuria.  No fever, diarrhea.   History provided by patient, family.    Past Medical History:  Diagnosis Date   History of kidney stones     Past Surgical History:  Procedure Laterality Date   COLONOSCOPY WITH PROPOFOL  N/A 02/03/2023   Procedure: COLONOSCOPY WITH PROPOFOL ;  Surgeon: Quintin Buckle, DO;  Location: [facility] ENDOSCOPY;  Service: Gastroenterology;  Laterality: N/A;   cystoscopy     DILATION AND CURETTAGE OF UTERUS     POLYPECTOMY  02/03/2023   Procedure: POLYPECTOMY;  Surgeon: Quintin Buckle, DO;  Location: [facility] ENDOSCOPY;  Service: Gastroenterology;;   TUBAL LIGATION      MEDICATIONS:  Prior to Admission medications   Medication Sig Start Date End Date Taking? Authorizing Provider  ascorbic acid (VITAMIN C) 500 MG tablet Take 1,000 mg by mouth daily.    [provider]  cholecalciferol (VITAMIN D3) 10 MCG/ML LIQD oral liquid Take 400 Units by mouth daily.    [provider]  ferrous sulfate 325 (65 FE) MG EC tablet Take 325 mg by mouth 3 (three) times daily with meals.    [provider]  magnesium sulfate (EPSOM SALT) GRAN Take 500 g by mouth as needed for mild constipation.    [provider]  medroxyPROGESTERone  (PROVERA ) 10 MG tablet Take 1 tablet (10 mg total) by mouth daily. 12/09/23   Delsie Figures, PA-C    Physical Exam   Triage Vital Signs: ED Triage Vitals [12/12/23 0600]  Encounter Vitals Group     BP 135/82     Systolic BP Percentile       Diastolic BP Percentile      Pulse Rate 86     Resp 18     Temp      Temp src      SpO2 98 %     Weight 147 lb 11.3 oz (67 kg)     Height 5\' 1"  (1.549 m)     Head Circumference      Peak Flow      Pain Score 10     Pain Loc      Pain Education      Exclude from Growth Chart     Most recent vital signs: Vitals:   12/12/23 0600 12/12/23 0629  BP: 135/82   Pulse: 86   Resp: 18   Temp:  98.4 F (36.9 C)  SpO2: 98%     CONSTITUTIONAL: Alert, responds appropriately to questions.  Appears uncomfortable HEAD: Normocephalic, atraumatic EYES: Conjunctivae clear, pupils appear equal, sclera nonicteric ENT: normal nose; moist mucous membranes NECK: Supple, normal ROM CARD: RRR; S1 and S2 appreciated RESP: Normal chest excursion without splinting or tachypnea; breath sounds clear and equal bilaterally; no wheezes, no rhonchi, no rales, no hypoxia or respiratory distress, speaking full sentences ABD/GI: Non-distended; soft, mild tenderness diffusely without guarding or rebound BACK: The back appears normal EXT: Normal ROM in all joints; no deformity noted, no edema SKIN: Normal color for age  and race; warm; no rash on exposed skin NEURO: Moves all extremities equally, normal speech PSYCH: The patient's mood and manner are appropriate.   ED Results / Procedures / Treatments   LABS: (all labs ordered are listed, but only abnormal results are displayed) Labs Reviewed  BASIC METABOLIC PANEL WITH GFR - Abnormal; Notable for the following components:      Result Value   Glucose, Bld 119 (*)    All other components within normal limits  URINALYSIS, ROUTINE W REFLEX MICROSCOPIC - Abnormal; Notable for the following components:   Color, Urine YELLOW (*)    APPearance CLOUDY (*)    Hgb urine dipstick LARGE (*)    Protein, ur 100 (*)    Leukocytes,Ua SMALL (*)    All other components within normal limits  CBC  HCG, QUANTITATIVE, PREGNANCY  HEPATIC FUNCTION PANEL  LIPASE, BLOOD      EKG:  EKG Interpretation Date/Time:    Ventricular Rate:    PR Interval:    QRS Duration:    QT Interval:    QTC Calculation:   R Axis:      Text Interpretation:           RADIOLOGY: My personal review and interpretation of imaging: CT pending.  I have personally reviewed all radiology reports.   No results found.   PROCEDURES:  Critical Care performed: No     Procedures    IMPRESSION / MDM / ASSESSMENT AND PLAN / ED COURSE  I reviewed the triage vital signs and the nursing notes.    Patient here with sudden onset flank and abdominal pain.  History of kidney stones.  The patient is on the cardiac monitor to evaluate for evidence of arrhythmia and/or significant heart rate changes.   DIFFERENTIAL DIAGNOSIS (includes but not limited to):   Kidney stones, UTI, pyelonephritis, appendicitis, ovarian torsion, ruptured ovarian cyst   Patient's presentation is most consistent with acute presentation with potential threat to life or bodily function.   PLAN: Will obtain labs, urine, CT of the abdomen pelvis.  Will give IV fluids, pain and nausea medicine.   MEDICATIONS GIVEN IN ED: Medications  sodium chloride  0.9 % bolus 1,000 mL (1,000 mLs Intravenous New Bag/Given 12/12/23 0627)  ketorolac (TORADOL) 30 MG/ML injection 30 mg (30 mg Intravenous Given 12/12/23 0625)  ondansetron (ZOFRAN) injection 4 mg (4 mg Intravenous Given 12/12/23 0622)  HYDROmorphone (DILAUDID) injection 1 mg (1 mg Intravenous Given 12/12/23 0624)     ED COURSE: Labs show no leukocytosis.  Normal creatinine.  Urine shows red blood cells, white blood cells but no nitrites, bacteria.  CT pending.  Signed out the oncoming ED physician.  Patient is currently pain-free after Dilaudid, Toradol.   CONSULTS: Pending further workup.   OUTSIDE RECORDS REVIEWED: Reviewed last internal medicine note on 10/12/2023.       FINAL CLINICAL IMPRESSION(S) / ED DIAGNOSES   Final diagnoses:  Flank  pain     Rx / DC Orders   ED Discharge Orders     None        Note:  This document was prepared using Dragon voice recognition software and may include unintentional dictation errors.   Keefer Soulliere, Clover Dao, DO 12/12/23 [phone]

## 2023-12-12 NOTE — ED Triage Notes (Signed)
 To ER with report of left flank pain starting suddenly at 5am.

## 2023-12-13 LAB — URINE CULTURE

## 2023-12-14 ENCOUNTER — Encounter: Payer: Self-pay | Admitting: Dietician

## 2023-12-14 ENCOUNTER — Encounter: Attending: Nurse Practitioner | Admitting: Dietician

## 2023-12-14 VITALS — Ht 61.0 in | Wt 149.6 lb

## 2023-12-14 DIAGNOSIS — E639 Nutritional deficiency, unspecified: Secondary | ICD-10-CM

## 2023-12-14 NOTE — Progress Notes (Signed)
 Medical Nutrition Therapy  Appointment Start time:  0805  Appointment End time:  0915 Employee Wellness Visit 1 of 3 EID: [id number]   Primary concerns today: Weight loss, Kidney stones  Referral diagnosis: E66.3 - Overweight Preferred learning style: No preference indicated Learning readiness: Ready   NUTRITION ASSESSMENT   Anthropometrics: Ht: 61 Wt: 149.6 lbs BMI: 28.27 kg/m2 Goal Weight: 135 - 140 lbs.   Clinical Medical Hx: Nephrolithiases, Insomnia Medications: Trazodone Labs: Reviewed Notable Signs/Symptoms: N/A   Lifestyle & Dietary Hx Pt reports desire to stop long term weight gain (12 lbs in the last year), return to goal weight of 135-140 lbs.  Pt reports going to ED with kidney stones last week, passed 1 but has 5 remaining, will be seeing a urologist in early July, would like nutrition strategies to prevent future stone formation. Pt reports irregular fluid intake, states they get busy and forget and also feel they don't have time for bathroom breaks at work. 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 Pt reports difficulty sleeping/staying asleep, previously tried ZzzQuil, now using Trazodone, still having some difficulties.   Estimated daily fluid intake: <64 oz Supplements: Iron Sleep: Irregular Stress / self-care: 7/10,  Current average weekly physical activity: ADL   24-Hr Dietary Recall First Meal: Coffee w/ flavored oatmilk creamer Snack:  Second Meal: Sal's 1/2 piece fried fish, small serving of fries, 1 breadstick, half a salad Snack:  Third Meal: Angel hair pasta, stir fry veggies, steak, Coke Snack:  Beverages: water    NUTRITION DIAGNOSIS  [place]-3.3 Overweight/obesity As related to positive energy balance.  As evidenced by BMI of 28.27 kg/m2, decreased activity, increased caloric  intake/stress over last few years.   NUTRITION INTERVENTION  Nutrition education (E-1) on the following topics:  Educated patient on the two components of energy balance: Energy in (calories), and energy out (activity). Explain the role of negative energy balance in weight loss. Discussed options with patient to achieve a negative energy balance and how to best control energy in and energy out to accommodate their lifestyle.   Handouts Provided Include  Kidney Stones Nutrition Therapy  Learning Style & Readiness for Change Teaching method utilized: Visual & Auditory  Demonstrated degree of understanding via: Teach Back  Barriers to learning/adherence to lifestyle change: None  Goals Established by Pt Begin taking sugar-free Jello cups into work as a snack to aid in hydration Purchase a bottle with a squirt nozzle to promote drinking fluids throughout the day. Pack some sugar-free flavoring packet in your bag to take with you to work! Start your day by adding a little bit of protein to your coffee. Begin to prepare lunch options on the weekend for 2 days of the work week, and on the other days take leftovers from the night before! Set aside 15 minutes every evening to walk after you eat your dinner! Begin to build your meals using the proportions of the Balanced Plate. First, select your carb choice(s) for the meal. Make this 25% of your meal. Next, select your source of protein to pair with your carb choice(s). Make this another 25% of your meal. Finally, complete your meal with a variety of non-starchy vegetables. Make this the remaining 50% of your meal. Review your Kidney Stone Nutrition Therapy worksheet to see if there are any  particular foods/beverages that you consume in a larger amount than recommended, moderate as possible!   MONITORING & EVALUATION Dietary intake, weekly physical activity, and weight change in 6 weeks.  Next Steps  Patient is to follow up wellness visit 2  of 3.

## 2023-12-14 NOTE — Patient Instructions (Addendum)
 Begin taking sugar-free Jello cups into work as a snack to aid in hydration!  Purchase a bottle with a squirt nozzle to promote drinking fluids throughout the day. Pack some sugar-free flavoring packet in your bag to take with you to work!  Start your day by adding a little bit of protein to your coffee.  Begin to prepare lunch options on the weekend for 2 days of the work week, and on the other days take leftovers from the night before!  Set aside 15 minutes every evening to walk after you eat your dinner!  Begin to build your meals using the proportions of the Balanced Plate. First, select your carb choice(s) for the meal. Make this 25% of your meal. Next, select your source of protein to pair with your carb choice(s). Make this another 25% of your meal. Finally, complete your meal with a variety of non-starchy vegetables. Make this the remaining 50% of your meal.  Review your Kidney Stone Nutrition Therapy worksheet to see if there are any particular foods/beverages that you consume in a larger amount than recommended, moderate as possible!

## 2023-12-27 ENCOUNTER — Other Ambulatory Visit: Payer: Self-pay | Admitting: Nurse Practitioner

## 2023-12-27 ENCOUNTER — Other Ambulatory Visit (HOSPITAL_COMMUNITY): Payer: Self-pay

## 2023-12-27 DIAGNOSIS — Z1231 Encounter for screening mammogram for malignant neoplasm of breast: Secondary | ICD-10-CM

## 2023-12-27 MED ORDER — TRAZODONE HCL 50 MG PO TABS
50.0000 mg | ORAL_TABLET | Freq: Every day | ORAL | 1 refills | Status: AC
  Filled 2023-12-27 – 2023-12-28 (×2): qty 90, 90d supply, fill #0
  Filled 2024-06-24: qty 90, 90d supply, fill #1

## 2023-12-28 ENCOUNTER — Other Ambulatory Visit: Payer: Self-pay

## 2023-12-28 ENCOUNTER — Ambulatory Visit: Admitting: Urology

## 2023-12-28 ENCOUNTER — Other Ambulatory Visit (HOSPITAL_COMMUNITY): Payer: Self-pay

## 2023-12-28 ENCOUNTER — Encounter: Payer: Self-pay | Admitting: Urology

## 2023-12-28 VITALS — BP 125/71 | HR 76 | Ht 61.0 in | Wt 147.0 lb

## 2023-12-28 DIAGNOSIS — N200 Calculus of kidney: Secondary | ICD-10-CM | POA: Diagnosis not present

## 2023-12-28 LAB — MICROSCOPIC EXAMINATION

## 2023-12-28 LAB — URINALYSIS, COMPLETE
Bilirubin, UA: NEGATIVE
Glucose, UA: NEGATIVE
Ketones, UA: NEGATIVE
Nitrite, UA: NEGATIVE
Protein,UA: NEGATIVE
RBC, UA: NEGATIVE
Specific Gravity, UA: 1.015 (ref 1.005–1.030)
Urobilinogen, Ur: 0.2 mg/dL (ref 0.2–1.0)
pH, UA: 8 — ABNORMAL HIGH (ref 5.0–7.5)

## 2023-12-29 NOTE — Progress Notes (Signed)
 I, Maysun LITTIE Griffiths, acting as a scribe for Glendia JAYSON Barba, MD., have documented all relevant documentation on the behalf of Glendia JAYSON Barba, MD, as directed by Glendia JAYSON Barba, MD while in the presence of Glendia JAYSON Barba, MD.  12/28/2023 10:15 PM   [name] [birth year] [id number]  Referring provider: Clarine Ozell LABOR, MD [address] [place],  [place] [address]  Chief Complaint  Patient presents with   Nephrolithiasis    HPI: Beverly Maldonado is a 48 y.o. female presents in follow up afer a ED visit for renal colic.  Presented to [facility] ED on 12/12/2023 complaining of acute onset severe left flank pain. She had nausea without vomiting and there were no identifiable, aggravating, or alleviating factors. Urinalysis showed >50 RBC/ 21-50 WBC. Urine culture gre multiple species. CT renal stone study remarkable for a 10 mm non-obstructing right lower pole calculus and small 1-2 mm bilateral non-obstructing renal calculi. No left ureteral calculi or hydronephtrosis were seen. However, the patient's pain had resolved prior to CT, and she has most likely passed a stone. Has a prior history of stone disease and her presenting symptoms were similar to previous known episodes She underwent SWL for a 9 mm calculus in [place]  several years ago, which was not successful, and subsequently underwent stent placement and ureteroscopy She states she had a metabolic evaluation, including a 24 hr several years ago, which showed no abnormalities.   PMH: Past Medical History:  Diagnosis Date   History of kidney stones     Surgical History: Past Surgical History:  Procedure Laterality Date   COLONOSCOPY WITH PROPOFOL  N/A 02/03/2023   Procedure: COLONOSCOPY WITH PROPOFOL ;  Surgeon: Onita Elspeth Ozell, DO;  Location: [facility] ENDOSCOPY;  Service: Gastroenterology;  Laterality: N/A;   cystoscopy     DILATION AND CURETTAGE OF UTERUS     POLYPECTOMY  02/03/2023   Procedure: POLYPECTOMY;  Surgeon: Onita Elspeth Ozell, DO;  Location: [facility] ENDOSCOPY;  Service: Gastroenterology;;   TUBAL LIGATION      Home Medications:  Allergies as of 12/28/2023   No Known Allergies      Medication List        Accurate as of December 28, 2023 11:59 PM. If you have any questions, ask your nurse or doctor.          STOP taking these medications    ascorbic acid 500 MG tablet Commonly known as: VITAMIN C Stopped by: Marine Lezotte C Deshanti Adcox   cholecalciferol 10 MCG/ML Liqd oral liquid Commonly known as: VITAMIN D3 Stopped by: Kathren Scearce C Jizel Cheeks   ferrous sulfate 325 (65 FE) MG EC tablet Stopped by: Glendia JAYSON Barba   magnesium sulfate Gran Commonly known as: EPSOM SALT Stopped by: Glendia JAYSON Barba   medroxyPROGESTERone  10 MG tablet Commonly known as: Provera  Stopped by: Glendia JAYSON Barba       TAKE these medications    ibuprofen  200 MG tablet Commonly known as: Motrin  IB Take 3 tablets (600 mg total) by mouth every 8 (eight) hours as needed.   ondansetron  4 MG disintegrating tablet Commonly known as: ZOFRAN -ODT Take 1 tablet (4 mg total) by mouth every 8 (eight) hours as needed for nausea or vomiting.   traZODone  50 MG tablet Commonly known as: DESYREL  Take 1 tablet (50 mg total) by mouth at bedtime        Allergies: No Known Allergies  Family History: Family History  Problem Relation Age of Onset   Breast cancer Maternal Grandmother  Social History:  reports that she has never smoked. She has never used smokeless tobacco. She reports that she does not currently use alcohol. She reports that she does not use drugs.   Physical Exam: BP 125/71   Pulse 76   Ht 5' 1 (1.549 m)   Wt 147 lb (66.7 kg)   LMP 12/06/2023   BMI 27.78 kg/m   Constitutional:  Alert and oriented, No acute distress. HEENT: [place] AT Respiratory: Normal respiratory effort, no increased work of breathing. Psychiatric: Normal mood and affect.   Pertinent Imaging: CT renal study was personally reviewed and  interpreted. Stone density of the 9 mm calculus is 800-900 Hu  CT Renal Stone Study  Narrative CLINICAL DATA:  Abdominal/flank pain, stone suspected.  EXAM: CT ABDOMEN AND PELVIS WITHOUT CONTRAST  TECHNIQUE: Multidetector CT imaging of the abdomen and pelvis was performed following the standard protocol without IV contrast.  RADIATION DOSE REDUCTION: This exam was performed according to the departmental dose-optimization program which includes automated exposure control, adjustment of the mA and/or kV according to patient size and/or use of iterative reconstruction technique.  COMPARISON:  None Available.  FINDINGS: Lower chest: There are patchy atelectatic changes in the visualized lung bases. No overt consolidation. No pleural effusion. The heart is normal in size. No pericardial effusion.  Hepatobiliary: The liver is normal in size. Non-cirrhotic configuration. No suspicious mass. These is mild diffuse hepatic steatosis. No intrahepatic or extrahepatic bile duct dilation. No calcified gallstones. Normal gallbladder wall thickness. No pericholecystic inflammatory changes.  Pancreas: Unremarkable. No pancreatic ductal dilatation or surrounding inflammatory changes.  Spleen: Within normal limits. No focal lesion.  Adrenals/Urinary Tract: Adrenal glands are unremarkable. No suspicious renal mass within the limitations of this unenhanced exam. There is a 9 x 10 mm nonobstructing calculus in the right kidney lower pole calyx. There are additional 2 other smaller 1-2 mm nonobstructing calculi in the right kidney and 2, 1-2 mm nonobstructing calculi in the left kidney. No ureterolithiasis or obstructive uropathy on either side. Unremarkable urinary bladder.  Stomach/Bowel: No disproportionate dilation of the small or large bowel loops. No evidence of abnormal bowel wall thickening or inflammatory changes. The appendix is unremarkable.  Vascular/Lymphatic: No ascites or  pneumoperitoneum. No abdominal or pelvic lymphadenopathy, by size criteria. No aneurysmal dilation of the major abdominal arteries.  Reproductive: The uterus is unremarkable. No large adnexal mass.  Other: There is a tiny fat containing umbilical hernia. The soft tissues and abdominal wall are otherwise unremarkable.  Musculoskeletal: No suspicious osseous lesions.  IMPRESSION: 1. There is a 9 x 10 mm nonobstructing calculus in the right kidney lower pole calyx. There are additional 2 other smaller 1-2 mm nonobstructing calculi in the right kidney and 2, 1-2 mm nonobstructing calculi in the left kidney. No ureterolithiasis or obstructive uropathy on either side. 2. Multiple other nonacute observations, as described above.   Electronically Signed By: Ree Molt M.D. On: 12/12/2023 08:31   Assessment & Plan:    1. Bilateral Nephrolithiasis Bilateral, non-obstructing renal calculi  Discussed the 10 mm calculus in the lower pole, which is less likely to move. Patient elected for surveillance. Follow-up visit scheduled in three months with a KUB to assess for any interval change. Instructed to call earlier if recurrent renal colic or flank pain occurs.  I have reviewed the above documentation for accuracy and completeness, and I agree with the above.   Glendia JAYSON Barba, MD  [facility] [address], [address]  [place], [place] [address] [phone]

## 2024-01-09 ENCOUNTER — Ambulatory Visit: Admitting: Urology

## 2024-01-17 ENCOUNTER — Ambulatory Visit
Admission: RE | Admit: 2024-01-17 | Discharge: 2024-01-17 | Disposition: A | Discharge status: Home / Self Care | Source: Ambulatory Visit | Attending: Nurse Practitioner | Admitting: Nurse Practitioner

## 2024-01-17 DIAGNOSIS — Z1231 Encounter for screening mammogram for malignant neoplasm of breast: Secondary | ICD-10-CM | POA: Insufficient documentation

## 2024-01-31 ENCOUNTER — Encounter: Attending: Nurse Practitioner | Admitting: Dietician

## 2024-01-31 VITALS — Ht 61.0 in | Wt 149.8 lb

## 2024-01-31 DIAGNOSIS — E639 Nutritional deficiency, unspecified: Secondary | ICD-10-CM | POA: Insufficient documentation

## 2024-01-31 NOTE — Patient Instructions (Addendum)
 Set a reminder on your phone to grab your protein shake at 6:10 am each workday morning.  Take about 30-60 seconds to stretch your calves after you get done walking. Try having a rehydration beverage like ZERO sugar Body Armor, Propel, or a Liquid IV packet or have a couple bread and butter pickles and take a couple small sips of the pickle juice.  Leave a glass next to your sink as a visual reminder to drink more water  while you are at home!

## 2024-01-31 NOTE — Progress Notes (Signed)
 Medical Nutrition Therapy  Appointment Start time:  [phone]  Appointment End time:  0845 Employee Wellness Visit 2 of 3 EID: [id number]   Primary concerns today: Weight loss, Kidney stones  Referral diagnosis: E66.3 - Overweight Preferred learning style: No preference indicated Learning readiness: Ready   NUTRITION ASSESSMENT   Anthropometrics: Ht: 61 Wt: 149.8 lbs BMI: 28.27 kg/m2 Goal Weight: 135 - 140 lbs.   Clinical Medical Hx: Nephrolithiases, Insomnia Medications: Trazodone  Labs: Reviewed Notable Signs/Symptoms: N/A   Lifestyle & Dietary Hx Pt reports adding in exercise most days of the way, walking 2 mile in the evening, states they enjoy it a lot. Pt repots adding in a Premier Protein in the morning, may occasionally forget to grab it on the way to work. Pt reports finding a 32 oz. water  bottle with hourly fluid goals to increase fluid intake, taking it to work daily, has been adding in low sodium, sugar free flavor packet (True Lemon). Pt reports a couple bouts of significant leg cramps in the middle of the night recently, happens in L calf muscle, usually on days after walking. Pt reports finishing their bachelor's degree last week, states they will believe it will help lower stress moving forward. Pt reports they will have a significant life change upcoming, states they believe this will help further lower stress and improve sleep as well.   Estimated daily fluid intake: 48 - 64 oz Supplements: Iron Sleep: Irregular Stress / self-care: 7/10 Current average weekly physical activity: ADL, walking 2 miles 4-6 days   24-Hr Dietary Recall First Meal: coffee w/ sweet creamer Snack:  Second Meal: Hamburger w/o bun, handful of chips Snack:  Third Meal: Pork tenderloin, [place]  blend vegetables Snack:  Beverages: water , coffee   NUTRITION DIAGNOSIS  -3.3 Overweight/obesity As related to positive energy balance.  As evidenced by BMI of 28.30 kg/m2, decreased  activity, increased caloric intake/stress over last few years.   NUTRITION INTERVENTION  Nutrition education (E-1) on the following topics:  Educated patient on the two components of energy balance: Energy in (calories), and energy out (activity). Explain the role of negative energy balance in weight loss. Discussed options with patient to achieve a negative energy balance and how to best control energy in and energy out to accommodate their lifestyle. Educated pt on hydration strategies and appropriate fluid goals.  Handouts Provided Include  Kidney Stones Nutrition Therapy Hydration Principles   Learning Style & Readiness for Change Teaching method utilized: Visual & Auditory  Demonstrated degree of understanding via: Teach Back  Barriers to learning/adherence to lifestyle change: None  Goals Established by Pt Set a reminder on your phone to grab your protein shake at 6:10 am each workday morning. Take about 30-60 seconds to stretch your calves after you get done walking. Try having a rehydration beverage like ZERO sugar Body Armor, Propel, or a Liquid IV packet or have a couple bread and butter pickles and take a couple small sips of the pickle juice. Leave a glass next to your sink as a visual reminder to drink more water  while you are at home!   MONITORING & EVALUATION Dietary intake, weekly physical activity, and weight change in 6 weeks.  Next Steps  Patient is to follow up wellness visit 3 of 3.

## 2024-03-07 ENCOUNTER — Encounter: Payer: Self-pay | Admitting: Dietician

## 2024-03-07 ENCOUNTER — Encounter: Attending: Nurse Practitioner | Admitting: Dietician

## 2024-03-07 VITALS — Ht 61.0 in | Wt 152.5 lb

## 2024-03-07 DIAGNOSIS — E639 Nutritional deficiency, unspecified: Secondary | ICD-10-CM | POA: Insufficient documentation

## 2024-03-07 NOTE — Progress Notes (Signed)
 Medical Nutrition Therapy  Appointment Start time:  [phone]  Appointment End time:  0845 Employee Wellness Visit 3 of 3 EID: [id number]   Primary concerns today: Weight loss, Kidney stones  Referral diagnosis: E66.3 - Overweight Preferred learning style: No preference indicated Learning readiness: Ready   NUTRITION ASSESSMENT   Anthropometrics: Ht: 61 Wt: 152.5 lbs BMI: 28.81 kg/m2 Goal Weight: 135 - 140 lbs.   Clinical Medical Hx: Nephrolithiases, Insomnia Medications: Trazodone  Labs: Reviewed Notable Signs/Symptoms: N/A   Lifestyle & Dietary Hx Pt reports having some ups and downs recently, resulting in a little more difficulty maintaining their diet and activity. Pt reports still being able to start their day with a protein shake, and drink at least 32 oz. Of water  while at work. Pt reports not needing to use Trazodone  as often now, sleeping better on days when they work.   Estimated daily fluid intake: 48 - 64 oz Supplements: Iron Sleep: Improvement, needing  Stress / self-care: 8/10 Current average weekly physical activity: ADLs   24-Hr Dietary Recall First Meal: Protein shake (banana), coffee Snack:  Second Meal: Burrito bowl (rice, pork, black beans, tomatoes, salsa, cheese, sour cream), handful of chips Snack:  Third Meal: Frozen thai curry chicken with rice Snack: Banana, 1 slice [place] pizza w/ alfredo sauce, peach beast (ETOH) Beverages: water , coffee, beast   NUTRITION DIAGNOSIS  [place]-3.3 Overweight/obesity As related to positive energy balance.  As evidenced by BMI of 28.30 kg/m2, decreased activity, increased caloric intake/stress over last few years.   NUTRITION INTERVENTION  Nutrition education (E-1) on the following topics:  Educated patient on the two components of energy balance: Energy in (calories), and energy out (activity). Explain the role of negative energy balance in weight loss. Discussed options with patient to achieve a negative energy  balance and how to best control energy in and energy out to accommodate their lifestyle. Educated pt on hydration strategies and appropriate fluid goals.  Handouts Provided Include  Kidney Stones Nutrition Therapy Hydration Principles   Learning Style & Readiness for Change Teaching method utilized: Visual & Auditory  Demonstrated degree of understanding via: Teach Back  Barriers to learning/adherence to lifestyle change: None  Goals Established by Pt Start considering 3 main proteins (chicken, beef, fish, beans, pork, etc.) to but weekly for you to prepare for your dinner and lunch meals throughout the week. Think of different starches/carbs and non-starchy vegetable choices to pair with them to build mix-n-match meals. Keep up the great work eating consistently and hydrating!   MONITORING & EVALUATION Dietary intake, weekly physical activity, and weight change PRN  Next Steps  Patient is to follow up with RD PRN

## 2024-03-07 NOTE — Patient Instructions (Signed)
 Start considering 3 main proteins (chicken, beef, fish, beans, pork, etc.) to but weekly for you to prepare for your dinner and lunch meals throughout the week. Think of different starches/carbs and non-starchy vegetable choices to pair with them to build mix-n-match meals.  Keep up the great work eating consistently and hydrating!

## 2024-03-26 ENCOUNTER — Other Ambulatory Visit (HOSPITAL_COMMUNITY): Payer: Self-pay

## 2024-03-26 MED ORDER — TRAZODONE HCL 50 MG PO TABS
50.0000 mg | ORAL_TABLET | Freq: Every day | ORAL | 1 refills | Status: AC
  Filled 2024-03-26 – 2024-03-27 (×2): qty 90, 90d supply, fill #0

## 2024-03-27 ENCOUNTER — Ambulatory Visit
Admission: RE | Admit: 2024-03-27 | Discharge: 2024-03-27 | Disposition: A | Discharge status: Home / Self Care | Source: Ambulatory Visit | Attending: Urology | Admitting: Urology

## 2024-03-27 ENCOUNTER — Other Ambulatory Visit: Payer: Self-pay

## 2024-03-27 ENCOUNTER — Ambulatory Visit: Admitting: Urology

## 2024-03-27 ENCOUNTER — Encounter: Payer: Self-pay | Admitting: Urology

## 2024-03-27 ENCOUNTER — Other Ambulatory Visit (HOSPITAL_COMMUNITY): Payer: Self-pay

## 2024-03-27 ENCOUNTER — Ambulatory Visit
Admission: RE | Admit: 2024-03-27 | Discharge: 2024-03-27 | Disposition: A | Discharge status: Home / Self Care | Attending: Urology | Admitting: Urology

## 2024-03-27 VITALS — BP 110/75 | HR 74 | Ht 61.0 in | Wt 150.0 lb

## 2024-03-27 DIAGNOSIS — N200 Calculus of kidney: Secondary | ICD-10-CM | POA: Diagnosis not present

## 2024-03-27 DIAGNOSIS — N2889 Other specified disorders of kidney and ureter: Secondary | ICD-10-CM | POA: Diagnosis not present

## 2024-03-27 NOTE — Progress Notes (Signed)
   03/27/2024 9:35 AM   Beverly Maldonado 1975-11-21 [id number]  Referring provider: Landy Margaretann Kay, FNP [address] [place],  [place] [address]  Chief Complaint  Patient presents with   Nephrolithiasis    HPI: Beverly Maldonado is a 48 y.o. female presents for 56-month follow-up.  Refer to my prior note 12/28/2023 No complaints since last visit.  Denies flank, abdominal or pelvic pain No bothersome LUTS or gross hematuria   PMH: Past Medical History:  Diagnosis Date   History of kidney stones     Surgical History: Past Surgical History:  Procedure Laterality Date   COLONOSCOPY WITH PROPOFOL  N/A 02/03/2023   Procedure: COLONOSCOPY WITH PROPOFOL ;  Surgeon: Onita Elspeth Sharper, DO;  Location: [facility] ENDOSCOPY;  Service: Gastroenterology;  Laterality: N/A;   cystoscopy     DILATION AND CURETTAGE OF UTERUS     POLYPECTOMY  02/03/2023   Procedure: POLYPECTOMY;  Surgeon: Onita Elspeth Sharper, DO;  Location: [facility] ENDOSCOPY;  Service: Gastroenterology;;   TUBAL LIGATION      Home Medications:  Allergies as of 03/27/2024   No Known Allergies      Medication List        Accurate as of March 27, 2024  9:35 AM. If you have any questions, ask your nurse or doctor.          ibuprofen  200 MG tablet Commonly known as: Motrin  IB Take 3 tablets (600 mg total) by mouth every 8 (eight) hours as needed.   ondansetron  4 MG disintegrating tablet Commonly known as: ZOFRAN -ODT Take 1 tablet (4 mg total) by mouth every 8 (eight) hours as needed for nausea or vomiting.   traZODone  50 MG tablet Commonly known as: DESYREL  Take 1 tablet (50 mg total) by mouth at bedtime   traZODone  50 MG tablet Commonly known as: DESYREL  Take 1 tablet (50 mg total) by mouth at bedtime        Allergies: No Known Allergies  Family History: Family History  Problem Relation Age of Onset   Breast cancer Maternal Grandmother     Social History:  reports that she has never smoked. She has  never used smokeless tobacco. She reports that she does not currently use alcohol. She reports that she does not use drugs.   Physical Exam: BP 110/75   Pulse 74   Ht 5' 1 (1.549 m)   Wt 150 lb (68 kg)   BMI 28.34 kg/m   Constitutional:  Alert, No acute distress. HEENT: [place] AT Respiratory: Normal respiratory effort, no increased work of breathing.  Psychiatric: Normal mood and affect.   Pertinent Imaging: KUB performed earlier today was personally reviewed and interpreted.  Stable 10 mm calcification overlying the right renal outline.  Faint calcification just inferior to left 12th rib  Assessment & Plan:    1.  Nephrolithiasis No interval change on KUB KUB 6 months.  Will review and contact patient If 50-month KUB stable 1 year follow-up office visit with KUB   Glendia JAYSON Barba, MD  [facility] [address], [address] [place], [place] [address] [phone]

## 2024-06-25 ENCOUNTER — Other Ambulatory Visit: Payer: Self-pay

## 2024-07-09 ENCOUNTER — Other Ambulatory Visit: Payer: Self-pay

## 2024-07-09 MED ORDER — ALBUTEROL SULFATE HFA 108 (90 BASE) MCG/ACT IN AERS
INHALATION_SPRAY | RESPIRATORY_TRACT | 1 refills | Status: AC
  Filled 2024-07-09: qty 6.7, 25d supply, fill #0
